# Patient Record
Sex: Female | Born: 1995 | Race: White | Hispanic: No | Marital: Single | Smoking: Former smoker
Health system: Southern US, Community
[De-identification: ages and names within clinical notes are randomized; demographics above are authoritative.]

## PROBLEM LIST (undated history)

## (undated) ENCOUNTER — Inpatient Hospital Stay (HOSPITAL_COMMUNITY): Payer: Self-pay

## (undated) DIAGNOSIS — T148XXA Other injury of unspecified body region, initial encounter: Secondary | ICD-10-CM

## (undated) DIAGNOSIS — B1920 Unspecified viral hepatitis C without hepatic coma: Secondary | ICD-10-CM

## (undated) DIAGNOSIS — F32A Depression, unspecified: Secondary | ICD-10-CM

## (undated) DIAGNOSIS — F4310 Post-traumatic stress disorder, unspecified: Secondary | ICD-10-CM

## (undated) DIAGNOSIS — F909 Attention-deficit hyperactivity disorder, unspecified type: Secondary | ICD-10-CM

## (undated) DIAGNOSIS — A64 Unspecified sexually transmitted disease: Secondary | ICD-10-CM

## (undated) DIAGNOSIS — T7840XA Allergy, unspecified, initial encounter: Secondary | ICD-10-CM

## (undated) DIAGNOSIS — F419 Anxiety disorder, unspecified: Secondary | ICD-10-CM

## (undated) DIAGNOSIS — J45909 Unspecified asthma, uncomplicated: Secondary | ICD-10-CM

## (undated) DIAGNOSIS — F1910 Other psychoactive substance abuse, uncomplicated: Secondary | ICD-10-CM

## (undated) DIAGNOSIS — F319 Bipolar disorder, unspecified: Secondary | ICD-10-CM

## (undated) DIAGNOSIS — F329 Major depressive disorder, single episode, unspecified: Secondary | ICD-10-CM

## (undated) DIAGNOSIS — R51 Headache: Secondary | ICD-10-CM

## (undated) DIAGNOSIS — N390 Urinary tract infection, site not specified: Secondary | ICD-10-CM

## (undated) HISTORY — PX: ADENOIDECTOMY: SUR15

## (undated) HISTORY — PX: TONSILLECTOMY: SUR1361

## (undated) HISTORY — PX: MYRINGOTOMY: SUR874

---
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
## 2016-01-17 ENCOUNTER — Telehealth: Payer: Self-pay | Admitting: *Deleted

## 2016-01-17 DIAGNOSIS — Z3042 Encounter for surveillance of injectable contraceptive: Secondary | ICD-10-CM

## 2016-01-17 MED ORDER — MEDROXYPROGESTERONE ACETATE 150 MG/ML IM SUSP: 150.0000 mg | INTRAMUSCULAR | 4 refills | Status: DC

## 2016-01-17 NOTE — Telephone Encounter (Signed)
Sent Depo to pharm per pt req

## 2016-01-18 ENCOUNTER — Encounter: Payer: Self-pay | Admitting: Family Medicine

## 2016-01-18 ENCOUNTER — Ambulatory Visit (INDEPENDENT_AMBULATORY_CARE_PROVIDER_SITE_OTHER): Payer: Medicaid Other | Admitting: Family Medicine

## 2016-01-18 VITALS — BP 105/68 | HR 73 | Temp 97.6°F | Wt 137.5 lb

## 2016-01-18 DIAGNOSIS — Z3042 Encounter for surveillance of injectable contraceptive: Secondary | ICD-10-CM | POA: Diagnosis not present

## 2016-01-18 DIAGNOSIS — Z3202 Encounter for pregnancy test, result negative: Secondary | ICD-10-CM

## 2016-01-18 LAB — POCT URINE PREGNANCY: Preg Test, Ur: NEGATIVE

## 2016-01-18 MED ORDER — MEDROXYPROGESTERONE ACETATE 150 MG/ML IM SUSP: 150.0000 mg | INTRAMUSCULAR | 2 refills | Status: DC

## 2016-01-18 MED ORDER — VENLAFAXINE HCL ER 75 MG PO CP24: 75.0000 mg | ORAL_CAPSULE | Freq: Every day | ORAL | 3 refills | Status: DC

## 2016-01-18 MED ORDER — HYDROXYZINE HCL 25 MG PO TABS
25.0000 mg | ORAL_TABLET | Freq: Three times a day (TID) | ORAL | 1 refills | Status: DC | PRN
Start: 2016-01-18 — End: 2017-06-19

## 2016-01-18 MED ORDER — MEDROXYPROGESTERONE ACETATE 150 MG/ML IM SUSP
150.0000 mg | INTRAMUSCULAR | Status: DC
Start: 2016-01-18 — End: 2017-12-25
  Administered 2016-01-18: 150 mg via INTRAMUSCULAR

## 2016-01-18 MED ORDER — NICOTINE 21 MG/24HR TD PT24: 21.0000 mg | MEDICATED_PATCH | Freq: Every day | TRANSDERMAL | 0 refills | Status: DC

## 2016-01-18 MED ORDER — NICOTINE 14 MG/24HR TD PT24: 14.0000 mg | MEDICATED_PATCH | Freq: Every day | TRANSDERMAL | 0 refills | Status: DC

## 2016-01-18 NOTE — Progress Notes (Signed)
Patient tolerated Depo injection well in Left arm.Marland Kitchen. Next appointment scheduled @ check-out.

## 2016-01-18 NOTE — Progress Notes (Signed)
Post Partum Exam  Leah Greene is a 20 y.o. G28P1001 female who presents for a postpartum visit. She is 6 weeks postpartum following a spontaneous vaginal delivery. I have fully reviewed the prenatal and intrapartum course. The delivery was at [redacted]w[redacted]d gestational weeks.  Anesthesia: epidural. Postpartum course has been complicated by CPS "taking the baby" and mother's underlying depression. Baby's course has been complicated by a NICU stay and being removed from her mother's custody. Baby is feeding by formula. Bleeding no bleeding. Bowel function is normal. Bladder function is normal. Patient is not sexually active. Contraception method is Depo-Provera injections. Postpartum depression screening:neg  Patient's mother monopolizes the conversation and MD needs to redirect to patient constantly The following portions of the patient's history were reviewed and updated as appropriate: allergies, current medications, past family history, past medical history, past social history, past surgical history and problem list.  Review of Systems Pertinent items are noted in HPI.   Objective:    BP 116/78 mmHg  Pulse 78  Resp 16  Ht 5\' 5"  (1.651 m)  Wt 211 lb (95.709 kg)  BMI 35.11 kg/m2  Breastfeeding? Yes  General:  alert, cooperative, appears stated age and tearful.    Breasts:  not performed. not breastfeeding. pumped x3 weeks  Lungs: normal WOB  Heart:  RR  Abdomen: soft, non-tender; bowel sounds normal; no masses,  no organomegaly   Vulva:  not evaluated  Vagina: not evaluated  Cervix:  not evaluated  Corpus: not examined  Adnexa:  not evaluated  Rectal Exam: Not performed.         Depression screen [facility] 2/9 01/18/2016 08/17/2015 07/06/2015  Decreased Interest 0 0 0  Down, Depressed, Hopeless 2 0 0  PHQ - 2 Score 2 0 0  Altered sleeping 2 - -  Tired, decreased energy 1 - -  Change in appetite 0 - -  Feeling bad or failure about yourself  2 - -  Trouble concentrating 1 - -  Moving slowly  or fidgety/restless 2 - -  Suicidal thoughts 0 - -  PHQ-9 Score 10 - -    Assessment:   Normal postpartum exam.  PHQ9 score 10.    Plan:   1. Contraception: Depo-Provera injections-- started today 2. Infant feeding: CPS has take custody of infant. Not pumping  3. Chronic medical conditions  Depression: extensive history of Depression/PTSD/Bipolar/ODD with sx today and PHQ9 10  - reviewed past medication with include prozac, lexapro, zoloft (caused SI), clonazepam, hydroxyzine  - Reports poor effect from all but has not tried SNRI (Effexor, Cymbalta, Pristiq). Patient open to starting  and expressed "I want to start something and my therapist thinks I need something"  -Continues to engage with therapy q week  - Reviewed 4-6 weeks for full effect of medication and to stop for SI/HI/worsening depression  -Rx for Effexor 75mg  sent to pharmacy-- anticipate psych taking over this rx and increasing if needed  -Hydroxyzine 25mg  q8 prn anxiety and sleep-- reviewed when to use for panic attacks and prn is  needed before bedtime to help with 'racing thoughts'  -Patient able to state when she would seek emergency psychiatric services and know of [facility] and  has used before  - Patient has appt with pysch on 10/12 to resume care    Tobacco use:  Desires to quit. Smoking > 1/2 ppd   Rx for Nicotine and sent to pharmacy. She will change dose q 1 month.   When dose  needed recommended discussion with PCP  3. Follow up in: 2 weeks or as needed.

## 2016-01-19 ENCOUNTER — Encounter: Payer: Self-pay | Admitting: *Deleted

## 2016-02-06 ENCOUNTER — Telehealth: Payer: Self-pay | Admitting: *Deleted

## 2016-02-06 DIAGNOSIS — N921 Excessive and frequent menstruation with irregular cycle: Secondary | ICD-10-CM

## 2016-02-06 MED ORDER — NORGESTIMATE-ETH ESTRADIOL 0.25-35 MG-MCG PO TABS: 1.0000 | ORAL_TABLET | Freq: Every day | ORAL | 3 refills | Status: DC

## 2016-02-06 NOTE — Telephone Encounter (Signed)
Pt just started the Depo, c/o breakthrough bleeding and stated she used Sprintec the last time she experienced it with starting the Depo and after a few months it resolved. Sent rx to pharmacy and instructed pt on medication use.

## 2016-02-06 NOTE — Telephone Encounter (Signed)
-----   Message from Olevia BowensJacinda S Battle sent at 02/06/2016  3:21 PM EDT ----- Regarding: Rx Request/Advise Contact: [phone] Having AUB w/ Depo, states she was given something in the past to stop it

## 2016-02-21 ENCOUNTER — Telehealth: Payer: Self-pay | Admitting: *Deleted

## 2016-02-21 NOTE — Telephone Encounter (Signed)
Pt received Depo Provera on 01-18-16, called the first of October c/o break through bleeding and requesting OCP to be sent to pharmacy to help with the bleeding.  Pt came in office today c/o bleeding every day.  Informed pt that she just started the OCP a few weeks ago and needs to give it more time, at least another month for her body to adjust.  Offered to draw a CBC and TSH to check levels, pt declined.  Instructed to continue taking the OCPs every day as instructed and call us back in a month if symptoms persist.

## 2016-04-04 ENCOUNTER — Ambulatory Visit: Payer: Self-pay

## 2016-04-05 ENCOUNTER — Ambulatory Visit: Payer: Medicaid Other

## 2016-06-24 ENCOUNTER — Emergency Department (HOSPITAL_COMMUNITY): Payer: Self-pay

## 2016-06-24 ENCOUNTER — Emergency Department (HOSPITAL_COMMUNITY)
Admission: EM | Admit: 2016-06-24 | Discharge: 2016-06-24 | Disposition: A | Discharge status: Home / Self Care | Payer: Self-pay | Attending: Emergency Medicine | Admitting: Emergency Medicine

## 2016-06-24 ENCOUNTER — Encounter (HOSPITAL_COMMUNITY): Payer: Self-pay | Admitting: Nurse Practitioner

## 2016-06-24 DIAGNOSIS — F909 Attention-deficit hyperactivity disorder, unspecified type: Secondary | ICD-10-CM | POA: Insufficient documentation

## 2016-06-24 DIAGNOSIS — F1910 Other psychoactive substance abuse, uncomplicated: Secondary | ICD-10-CM

## 2016-06-24 DIAGNOSIS — L03114 Cellulitis of left upper limb: Secondary | ICD-10-CM | POA: Insufficient documentation

## 2016-06-24 DIAGNOSIS — L03119 Cellulitis of unspecified part of limb: Secondary | ICD-10-CM

## 2016-06-24 DIAGNOSIS — Z7982 Long term (current) use of aspirin: Secondary | ICD-10-CM | POA: Insufficient documentation

## 2016-06-24 DIAGNOSIS — Z79899 Other long term (current) drug therapy: Secondary | ICD-10-CM | POA: Insufficient documentation

## 2016-06-24 DIAGNOSIS — L03113 Cellulitis of right upper limb: Secondary | ICD-10-CM | POA: Insufficient documentation

## 2016-06-24 DIAGNOSIS — Z91040 Latex allergy status: Secondary | ICD-10-CM | POA: Insufficient documentation

## 2016-06-24 DIAGNOSIS — F17210 Nicotine dependence, cigarettes, uncomplicated: Secondary | ICD-10-CM | POA: Insufficient documentation

## 2016-06-24 DIAGNOSIS — F332 Major depressive disorder, recurrent severe without psychotic features: Secondary | ICD-10-CM | POA: Insufficient documentation

## 2016-06-24 LAB — COMPREHENSIVE METABOLIC PANEL
ALK PHOS: 65 U/L (ref 38–126)
ALT: 104 U/L — ABNORMAL HIGH (ref 14–54)
ANION GAP: 8 (ref 5–15)
AST: 51 U/L — ABNORMAL HIGH (ref 15–41)
Albumin: 3.5 g/dL (ref 3.5–5.0)
BILIRUBIN TOTAL: 0.3 mg/dL (ref 0.3–1.2)
BUN: 9 mg/dL (ref 6–20)
CALCIUM: 8.5 mg/dL — AB (ref 8.9–10.3)
CO2: 24 mmol/L (ref 22–32)
Chloride: 106 mmol/L (ref 101–111)
Creatinine, Ser: 0.65 mg/dL (ref 0.44–1.00)
Glucose, Bld: 102 mg/dL — ABNORMAL HIGH (ref 65–99)
Potassium: 3.8 mmol/L (ref 3.5–5.1)
Sodium: 138 mmol/L (ref 135–145)
TOTAL PROTEIN: 6.4 g/dL — AB (ref 6.5–8.1)

## 2016-06-24 LAB — CBC
HEMATOCRIT: 34.4 % — AB (ref 36.0–46.0)
Hemoglobin: 11.4 g/dL — ABNORMAL LOW (ref 12.0–15.0)
MCH: 27.8 pg (ref 26.0–34.0)
MCHC: 33.1 g/dL (ref 30.0–36.0)
MCV: 83.9 fL (ref 78.0–100.0)
Platelets: 230 10*3/uL (ref 150–400)
RBC: 4.1 MIL/uL (ref 3.87–5.11)
RDW: 14.3 % (ref 11.5–15.5)
WBC: 5.7 10*3/uL (ref 4.0–10.5)

## 2016-06-24 LAB — PREGNANCY, URINE: PREG TEST UR: NEGATIVE

## 2016-06-24 LAB — ETHANOL

## 2016-06-24 LAB — ACETAMINOPHEN LEVEL

## 2016-06-24 LAB — RAPID URINE DRUG SCREEN, HOSP PERFORMED
Amphetamines: NOT DETECTED
BARBITURATES: NOT DETECTED
Benzodiazepines: NOT DETECTED
Cocaine: POSITIVE — AB
Opiates: NOT DETECTED
Tetrahydrocannabinol: POSITIVE — AB

## 2016-06-24 LAB — SALICYLATE LEVEL: Salicylate Lvl: <7 mg/dL (ref 2.8–30.0)

## 2016-06-24 MED ORDER — CLINDAMYCIN HCL 300 MG PO CAPS
300.0000 mg | ORAL_CAPSULE | Freq: Three times a day (TID) | ORAL | Status: DC
  Filled 2016-06-24 (×2): qty 1

## 2016-06-24 MED ORDER — CLINDAMYCIN HCL 150 MG PO CAPS: 300.0000 mg | ORAL_CAPSULE | Freq: Three times a day (TID) | ORAL | 0 refills | Status: AC

## 2016-06-24 NOTE — ED Triage Notes (Signed)
Pt states she is tired of being a harm to herself, states she is depressed, and tired doing drugs, last IVDU was 3 days ago when she "shot up cocaine." Denies suicidal or homicidal ideation. Requesting psychiatry evaluation and help.

## 2016-06-24 NOTE — ED Provider Notes (Signed)
[facility]-EMERGENCY DEPT Provider Note   CSN: [id number] Arrival date & time: 06/24/16  1530     History   Chief Complaint Chief Complaint  Patient presents with  . Depression    HPI Leah Greene is a 21 y.o. female.  HPI   21 year old female presents with concern for depression and drug addiction. Patient reports worsening depression over the last 4 months, and the sensation that if she does not get help soon she might end up killing herself with drug use. Reports that she is primarily smoking marijuana and using cocaine. She is injecting cocaine. Reports she sees therapist however has not been as good about going as she has been using drugs. ALso reports she has rx for medications however has not been compliant taking them.  Reports painful skin lesions from injecting. Also reports cough. Denies fevers.   Past Medical History:  Diagnosis Date  . ADHD (attention deficit hyperactivity disorder)   . Allergy   . Anxiety    was on meds - stopped with preg  . Anxiety   . Bipolar 1 disorder (HCC)   . Depression   . Fractured bone    rt and left ankles; sport activity  . Headache(784.0)   . PTSD (post-traumatic stress disorder)   . Sexually transmitted disease (STD)    unsure of type  . Urinary tract infection     Patient Active Problem List   Diagnosis Date Noted  . MDD (major depressive disorder), recurrent episode, severe (HCC) 07/12/2015  . Bipolar I disorder, most recent episode manic, severe without psychotic features (HCC) 08/31/2014  . Family history of breast cancer in first degree relative 03/03/2014  . Cannabis use disorder, moderate, dependence (HCC) 04/07/2013  . ODD (oppositional defiant disorder) 04/04/2013  . ADHD (attention deficit hyperactivity disorder), combined type 04/04/2013  . PTSD (post-traumatic stress disorder) 04/04/2013  . GERD (gastroesophageal reflux disease) 09/30/2012    Past Surgical History:  Procedure Laterality Date  . ADENOIDECTOMY     . MYRINGOTOMY    . TONSILLECTOMY      OB History    Gravida Para Term Preterm AB Living   2 2 1 1  0 2   SAB TAB Ectopic Multiple Live Births   0       2       Home Medications    Prior to Admission medications   Medication Sig Start Date End Date Taking? Authorizing Provider  aspirin 325 MG EC tablet Take 325 mg by mouth as needed for pain.   Yes Historical Provider, MD  cetirizine (ZYRTEC) 10 MG tablet Take 10 mg by mouth daily as needed for allergies.   Yes Historical Provider, MD  ibuprofen (ADVIL,MOTRIN) 600 MG tablet Take 1 tablet (600 mg total) by mouth every 6 (six) hours. Patient taking differently: Take 400 mg by mouth every 6 (six) hours.  12/12/15  Yes Tereso NewcomerUgonna A Anyanwu, MD  medroxyPROGESTERone (DEPO-PROVERA) 150 MG/ML injection Inject 1 mL (150 mg total) into the muscle every 3 (three) months. 01/18/16  Yes Federico FlakeKimberly Niles Newton, MD  venlafaxine XR (EFFEXOR-XR) 75 MG 24 hr capsule Take 1 capsule (75 mg total) by mouth daily. 01/18/16  Yes Federico FlakeKimberly Niles Newton, MD  clindamycin (CLEOCIN) 150 MG capsule Take 2 capsules (300 mg total) by mouth 3 (three) times daily. 06/24/16 07/01/16  Alvira MondayErin Somtochukwu Woollard, MD  hydrOXYzine (ATARAX/VISTARIL) 25 MG tablet Take 1 tablet (25 mg total) by mouth 3 (three) times daily as needed. Patient not taking: Reported on 06/24/2016  01/18/16   Federico Flake, MD  nicotine (NICODERM CQ) 14 mg/24hr patch Place 1 patch (14 mg total) onto the skin daily. 01/18/16   Federico Flake, MD  nicotine (NICODERM CQ) 21 mg/24hr patch Place 1 patch (21 mg total) onto the skin daily. Patient not taking: Reported on 06/24/2016 01/18/16   Federico Flake, MD  norgestimate-ethinyl estradiol (ORTHO-CYCLEN,SPRINTEC,PREVIFEM) 0.25-35 MG-MCG tablet Take 1 tablet by mouth daily. Patient not taking: Reported on 06/24/2016 02/06/16   Tereso Newcomer, MD    Family History Family History  Problem Relation Age of Onset  . Diabetes Mother   . Cancer Mother      breast, ovarian  . Bipolar disorder Maternal Grandfather     Social History Social History  Substance Use Topics  . Smoking status: Current Every Day Smoker    Packs/day: 0.25    Types: Cigarettes  . Smokeless tobacco: Never Used  . Alcohol use No     Allergies   Amoxicillin; Promethazine; Latex; Penicillins; and Vancomycin   Review of Systems Review of Systems  Constitutional: Negative for fever.  HENT: Negative for sore throat.   Eyes: Negative for visual disturbance.  Respiratory: Positive for cough. Negative for shortness of breath.   Cardiovascular: Negative for chest pain.  Gastrointestinal: Negative for abdominal pain, nausea and vomiting.  Genitourinary: Negative for difficulty urinating.  Musculoskeletal: Negative for back pain and neck pain.  Skin: Positive for wound. Negative for rash.  Neurological: Negative for syncope and headaches.     Physical Exam Updated Vital Signs BP (!) 114/44 (BP Location: Right Arm)   Pulse 76   Temp 98.5 F (36.9 C) (Oral)   Resp 16   Ht 5\' 4"  (1.626 m)   Wt 127 lb (57.6 kg)   SpO2 97%   BMI 21.80 kg/m   Physical Exam  Constitutional: She is oriented to person, place, and time. She appears well-developed and well-nourished. No distress.  HENT:  Head: Normocephalic and atraumatic.  Eyes: Conjunctivae and EOM are normal.  Neck: Normal range of motion.  Cardiovascular: Normal rate, regular rhythm, normal heart sounds and intact distal pulses.  Exam reveals no gallop and no friction rub.   No murmur heard. Pulmonary/Chest: Effort normal and breath sounds normal. No respiratory distress. She has no wheezes. She has no rales.  Abdominal: Soft. She exhibits no distension. There is no tenderness. There is no guarding.  Musculoskeletal: She exhibits no edema or tenderness.  Neurological: She is alert and oriented to person, place, and time.  Skin: Skin is warm and dry. No rash noted. She is not diaphoretic.  Multiple linear  scars over bilateral forearms, hands. Right AC with 1cm area of erythema, nodular, no fluctuance Right wrist with tender area 2cm, no fluctuance, nodular, mild overlying erythema. Left posterior forearm with 2cm area nodular swelling, no fluctuance  Nursing note and vitals reviewed.    ED Treatments / Results  Labs (all labs ordered are listed, but only abnormal results are displayed) Labs Reviewed  COMPREHENSIVE METABOLIC PANEL - Abnormal; Notable for the following:       Result Value   Glucose, Bld 102 (*)    Calcium 8.5 (*)    Total Protein 6.4 (*)    AST 51 (*)    ALT 104 (*)    All other components within normal limits  ACETAMINOPHEN LEVEL - Abnormal; Notable for the following:    Acetaminophen (Tylenol), Serum <10 (*)    All other components within normal  limits  CBC - Abnormal; Notable for the following:    Hemoglobin 11.4 (*)    HCT 34.4 (*)    All other components within normal limits  RAPID URINE DRUG SCREEN, HOSP PERFORMED - Abnormal; Notable for the following:    Cocaine POSITIVE (*)    Tetrahydrocannabinol POSITIVE (*)    All other components within normal limits  ETHANOL  SALICYLATE LEVEL  PREGNANCY, URINE    EKG  EKG Interpretation None       Radiology Dg Chest 2 View  Result Date: 06/24/2016 CLINICAL DATA:  Cough.  Concern for foreign body. EXAM: CHEST  2 VIEW COMPARISON:  None. FINDINGS: The cardiomediastinal contours are normal. The lungs are clear. Pulmonary vasculature is normal. No consolidation, pleural effusion, or pneumothorax. No acute osseous abnormalities are seen. No radiopaque foreign body. IMPRESSION: No active cardiopulmonary disease.  No radiopaque foreign body. Electronically Signed   By: Rubye Oaks M.D.   On: 06/24/2016 19:09   Dg Forearm Left  Result Date: 06/24/2016 CLINICAL DATA:  IV drug use, concern for foreign body. EXAM: LEFT FOREARM - 2 VIEW COMPARISON:  None. FINDINGS: There is no evidence of fracture or other focal bone  lesions. No periosteal reaction or abnormal density. Wrist and elbow alignment is maintained. Mild soft tissue edema about the mid forearm. No radiopaque foreign body. IMPRESSION: Mild soft tissue edema. No radiopaque foreign body. No acute osseous abnormality. Electronically Signed   By: Rubye Oaks M.D.   On: 06/24/2016 19:11   Dg Forearm Right  Result Date: 06/24/2016 CLINICAL DATA:  IV drug use, concern for foreign body. EXAM: RIGHT FOREARM - 2 VIEW COMPARISON:  None. FINDINGS: There is no evidence of fracture or other focal bone lesions. No periosteal reaction or abnormal density. Wrist and elbow alignment is maintained. Multifocal/diffuse soft tissue edema about the forearm, most prominent about the dorsal surface. No evident soft tissue air. No radiopaque foreign body. IMPRESSION: Multifocal/diffuse soft tissue edema. No radiopaque foreign body. No acute osseous abnormality. Electronically Signed   By: Rubye Oaks M.D.   On: 06/24/2016 19:11    Procedures [place] bedside Date/Time: 06/25/2016 2:46 PM Performed by: Alvira Monday Authorized by: Alvira Monday  Consent: Verbal consent obtained. Required items: required blood products, implants, devices, and special equipment available Patient identity confirmed: verbally with patient Time out: Immediately prior to procedure a "time out" was called to verify the correct patient, procedure, equipment, support staff and site/side marked as required. Patient tolerance: Patient tolerated the procedure well with no immediate complications Comments: Bedside US, softissue to evaluate for abscess 2 views taken 2 different areas of concern on right forearm and left forearm Signs of tissue edema No significant discrete fluid collection     (including critical care time)  Medications Ordered in ED Medications - No data to display   Initial Impression / Assessment and Plan / ED Course  I have reviewed the triage vital signs and the  nursing notes.  Pertinent labs & imaging results that were available during my care of the patient were reviewed by me and considered in my medical decision making (see chart for details).    21yo female with history of bipolar presents with concern for worsening depression and substance abuse.  She denies active SI/HI/hallucinations but reports she is worried she will kill herself if her substance abuse continues this way. TTS evaluated patient and do not feel she meets inpatient criteria and recommend continued outpt follow up and provided resources for substance  abuse treatment.   Patient also with multiple track marks, some with surrounding areas of inflammation, nodularity, suspect scarring and cellulitis. No clear fluid pockets on bedside US nor fluctuance on exam.  Pt without fever.  Gave rx for clindamycin for cellulitis and recommended continued close monitoring of areas, trying warm compresses.  She has elevation of LFTs also noted on prior labs. Recommend testing for hepatitis/HIV and pt reports she will have this done as oupt. Patient discharged in stable condition with understanding of reasons to return.   Final Clinical Impressions(s) / ED Diagnoses   Final diagnoses:  Severe episode of recurrent major depressive disorder, without psychotic features (HCC)  Drug abuse  Cellulitis of upper extremity, unspecified laterality    New Prescriptions Discharge Medication List as of 06/24/2016  8:21 PM    START taking these medications   Details  clindamycin (CLEOCIN) 150 MG capsule Take 2 capsules (300 mg total) by mouth 3 (three) times daily., Starting Sun 06/24/2016, Until Sun 07/01/2016, Print         Alvira Monday, MD 06/25/16 1451

## 2016-06-24 NOTE — ED Notes (Signed)
Went into the room to introduce self to pt. Pt found to be sleeping with rise and fall of chest noted.

## 2016-06-24 NOTE — BH Assessment (Signed)
Pt provided with resources for SA treatment programs and suicide prevention information.   Princess BruinsAquicha Duff, MSW, Theresia MajorsLCSWA
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
## 2016-06-24 NOTE — ED Notes (Signed)
Pt has given verbal consent to share information with her mother Dorise BullionDawn Heng at [phone] cell phone preferable, home phone [phone]

## 2016-10-10 ENCOUNTER — Emergency Department (HOSPITAL_COMMUNITY)
Admission: EM | Admit: 2016-10-10 | Discharge: 2016-10-11 | Disposition: A | Discharge status: Home / Self Care | Payer: No Typology Code available for payment source | Source: Home / Self Care

## 2016-10-10 ENCOUNTER — Encounter (HOSPITAL_COMMUNITY): Payer: Self-pay | Admitting: *Deleted

## 2016-10-10 DIAGNOSIS — S0990XA Unspecified injury of head, initial encounter: Secondary | ICD-10-CM | POA: Diagnosis present

## 2016-10-10 DIAGNOSIS — S299XXA Unspecified injury of thorax, initial encounter: Secondary | ICD-10-CM | POA: Insufficient documentation

## 2016-10-10 DIAGNOSIS — S20219A Contusion of unspecified front wall of thorax, initial encounter: Secondary | ICD-10-CM | POA: Diagnosis not present

## 2016-10-10 DIAGNOSIS — R51 Headache: Secondary | ICD-10-CM | POA: Diagnosis not present

## 2016-10-10 DIAGNOSIS — Y92410 Unspecified street and highway as the place of occurrence of the external cause: Secondary | ICD-10-CM | POA: Insufficient documentation

## 2016-10-10 DIAGNOSIS — Y939 Activity, unspecified: Secondary | ICD-10-CM

## 2016-10-10 DIAGNOSIS — F1290 Cannabis use, unspecified, uncomplicated: Secondary | ICD-10-CM | POA: Diagnosis not present

## 2016-10-10 DIAGNOSIS — Z79899 Other long term (current) drug therapy: Secondary | ICD-10-CM | POA: Diagnosis not present

## 2016-10-10 DIAGNOSIS — Y999 Unspecified external cause status: Secondary | ICD-10-CM

## 2016-10-10 DIAGNOSIS — J45909 Unspecified asthma, uncomplicated: Secondary | ICD-10-CM | POA: Diagnosis not present

## 2016-10-10 DIAGNOSIS — S161XXA Strain of muscle, fascia and tendon at neck level, initial encounter: Secondary | ICD-10-CM | POA: Diagnosis not present

## 2016-10-10 DIAGNOSIS — S93402A Sprain of unspecified ligament of left ankle, initial encounter: Secondary | ICD-10-CM | POA: Diagnosis not present

## 2016-10-10 DIAGNOSIS — F17210 Nicotine dependence, cigarettes, uncomplicated: Secondary | ICD-10-CM | POA: Diagnosis not present

## 2016-10-10 DIAGNOSIS — Z91040 Latex allergy status: Secondary | ICD-10-CM | POA: Diagnosis not present

## 2016-10-10 DIAGNOSIS — Y929 Unspecified place or not applicable: Secondary | ICD-10-CM | POA: Diagnosis not present

## 2016-10-10 DIAGNOSIS — S0003XA Contusion of scalp, initial encounter: Secondary | ICD-10-CM | POA: Diagnosis not present

## 2016-10-10 HISTORY — DX: Unspecified viral hepatitis C without hepatic coma: B19.20

## 2016-10-10 HISTORY — DX: Unspecified asthma, uncomplicated: J45.909
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
## 2016-10-10 NOTE — ED Notes (Signed)
Called for room x2 no answer. 

## 2016-10-11 ENCOUNTER — Encounter (HOSPITAL_COMMUNITY): Payer: Self-pay | Admitting: Emergency Medicine

## 2016-10-11 ENCOUNTER — Emergency Department (HOSPITAL_COMMUNITY): Payer: No Typology Code available for payment source

## 2016-10-11 ENCOUNTER — Emergency Department (HOSPITAL_COMMUNITY)
Admission: EM | Admit: 2016-10-11 | Discharge: 2016-10-11 | Disposition: A | Discharge status: Home / Self Care | Payer: No Typology Code available for payment source | Attending: Emergency Medicine | Admitting: Emergency Medicine

## 2016-10-11 DIAGNOSIS — Y939 Activity, unspecified: Secondary | ICD-10-CM | POA: Insufficient documentation

## 2016-10-11 DIAGNOSIS — S161XXA Strain of muscle, fascia and tendon at neck level, initial encounter: Secondary | ICD-10-CM | POA: Insufficient documentation

## 2016-10-11 DIAGNOSIS — Z91040 Latex allergy status: Secondary | ICD-10-CM | POA: Insufficient documentation

## 2016-10-11 DIAGNOSIS — Z79899 Other long term (current) drug therapy: Secondary | ICD-10-CM | POA: Insufficient documentation

## 2016-10-11 DIAGNOSIS — Z419 Encounter for procedure for purposes other than remedying health state, unspecified: Secondary | ICD-10-CM

## 2016-10-11 DIAGNOSIS — S20219A Contusion of unspecified front wall of thorax, initial encounter: Secondary | ICD-10-CM

## 2016-10-11 DIAGNOSIS — S93402A Sprain of unspecified ligament of left ankle, initial encounter: Secondary | ICD-10-CM | POA: Insufficient documentation

## 2016-10-11 DIAGNOSIS — R51 Headache: Secondary | ICD-10-CM | POA: Insufficient documentation

## 2016-10-11 DIAGNOSIS — F1290 Cannabis use, unspecified, uncomplicated: Secondary | ICD-10-CM | POA: Insufficient documentation

## 2016-10-11 DIAGNOSIS — F17210 Nicotine dependence, cigarettes, uncomplicated: Secondary | ICD-10-CM | POA: Insufficient documentation

## 2016-10-11 DIAGNOSIS — J45909 Unspecified asthma, uncomplicated: Secondary | ICD-10-CM | POA: Insufficient documentation

## 2016-10-11 DIAGNOSIS — R52 Pain, unspecified: Secondary | ICD-10-CM

## 2016-10-11 DIAGNOSIS — Y929 Unspecified place or not applicable: Secondary | ICD-10-CM | POA: Insufficient documentation

## 2016-10-11 DIAGNOSIS — S0003XA Contusion of scalp, initial encounter: Secondary | ICD-10-CM | POA: Insufficient documentation

## 2016-10-11 DIAGNOSIS — Y999 Unspecified external cause status: Secondary | ICD-10-CM | POA: Insufficient documentation

## 2016-10-11 LAB — I-STAT CHEM 8, ED
BUN: 11 mg/dL (ref 6–20)
CREATININE: 0.6 mg/dL (ref 0.44–1.00)
Calcium, Ion: 1.12 mmol/L — ABNORMAL LOW (ref 1.15–1.40)
Chloride: 100 mmol/L — ABNORMAL LOW (ref 101–111)
Glucose, Bld: 81 mg/dL (ref 65–99)
HEMATOCRIT: 37 % (ref 36.0–46.0)
Hemoglobin: 12.6 g/dL (ref 12.0–15.0)
POTASSIUM: 3.7 mmol/L (ref 3.5–5.1)
Sodium: 138 mmol/L (ref 135–145)
TCO2: 27 mmol/L (ref 0–100)

## 2016-10-11 LAB — RAPID URINE DRUG SCREEN, HOSP PERFORMED
AMPHETAMINES: NOT DETECTED
BARBITURATES: NOT DETECTED
BENZODIAZEPINES: NOT DETECTED
Cocaine: POSITIVE — AB
Opiates: POSITIVE — AB
TETRAHYDROCANNABINOL: POSITIVE — AB

## 2016-10-11 LAB — POC URINE PREG, ED: Preg Test, Ur: NEGATIVE

## 2016-10-11 MED ORDER — DICLOFENAC SODIUM 50 MG PO TBEC
50.0000 mg | DELAYED_RELEASE_TABLET | Freq: Two times a day (BID) | ORAL | 0 refills | Status: DC
Start: 2016-10-11 — End: 2017-06-19

## 2016-10-11 MED ORDER — CYCLOBENZAPRINE HCL 5 MG PO TABS: 5.0000 mg | ORAL_TABLET | Freq: Three times a day (TID) | ORAL | 0 refills | Status: DC | PRN

## 2016-10-11 MED ORDER — BACITRACIN ZINC 500 UNIT/GM EX OINT: TOPICAL_OINTMENT | Freq: Two times a day (BID) | CUTANEOUS | Status: DC
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
## 2016-10-11 NOTE — ED Notes (Signed)
Patient transported to X-ray 
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
## 2016-10-11 NOTE — Discharge Instructions (Signed)
Take the medication as directed. Do not drive while taking the medication as it will make you sleepy. Follow up with your doctor. Return here as needed.

## 2016-10-11 NOTE — Progress Notes (Signed)
Orthopedic Tech Progress Note Patient Details:  Treasa Schoolheresa R [place] Nov 29, 1995 [id number]  Ortho Devices Type of Ortho Device: ASO Ortho Device/Splint Location: LLE Ortho Device/Splint Interventions: Ordered, Application   Jennye MoccasinHughes, Tandrea Kommer Craig 10/11/2016, 5:52 PM

## 2016-11-03 IMAGING — US US MFM OB LIMITED
1 series · 15 of 27 positions shown · non-contrast
Comparison: none

MAU/Triage

1  RYHEM ROSI             [id number]      [id number]     [id number]
Indications
Vaginal bleeding in pregnancy, second
trimester
15 weeks gestation of pregnancy
OB History
Gravidity:    2         Term:   1        Prem:   0        SAB:   0
TOP:          0       Ectopic:  0        Living: 1
Fetal Evaluation
Num Of Fetuses:     1
Fetal Heart         150
Rate(bpm):
Cardiac Activity:   Observed
Presentation:       Cephalic
Placenta:           Anterior, above cervical os
Amniotic Fluid
AFI FV:      Subjectively within normal limits
Comment:    Large subchorionic hemorrhage noted, in inferior and rt
lateral uterus.
Gestational Age
Clinical EDD:  15w 0d                                        EDD:   01/16/16
Best:          15w 0d     Det. By:  Clinical EDD             EDD:   01/16/16
Cervix Uterus Adnexa
Left Ovary
Within normal limits.
Right Ovary
Impression
INDICATION: 19 yr old 88Z1OO1 at 28w8d with vaginal bleeding
for fetal ultrasound. Remote read.

[Series 1: us mfm ob limited · 27 acquisitions, 15 frames shown]
[im 1/27]
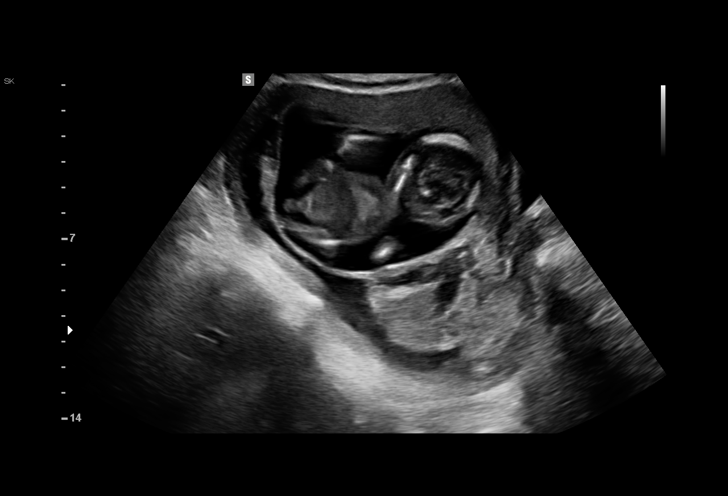
[im 3/27]
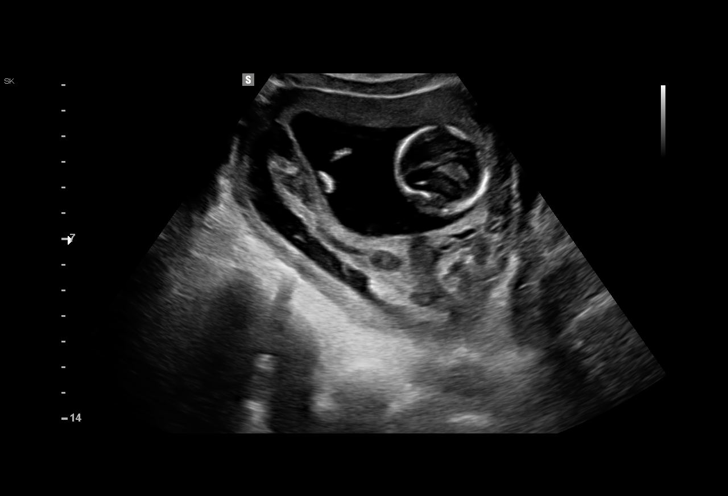
[im 5/27]
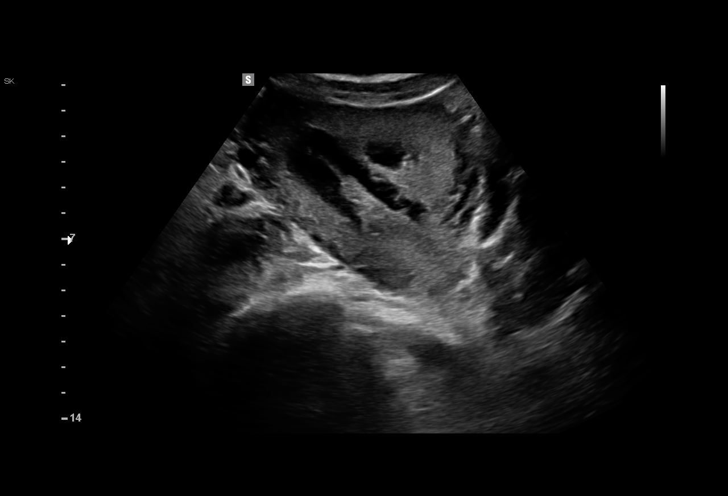
[im 7/27]
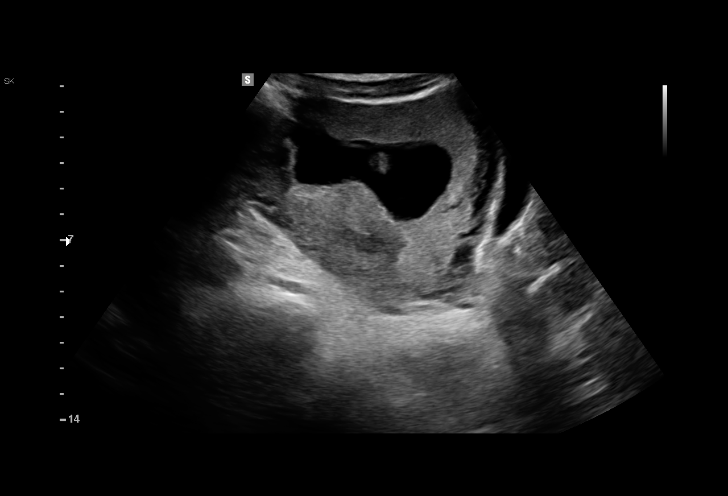
[im 9/27]
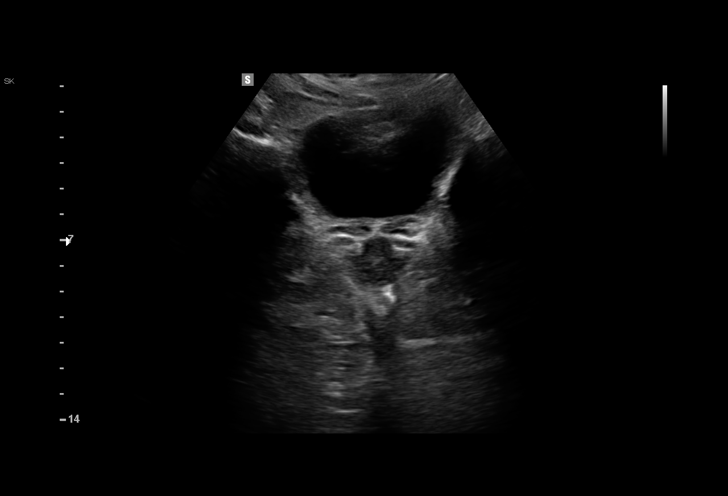
[im 10/27]
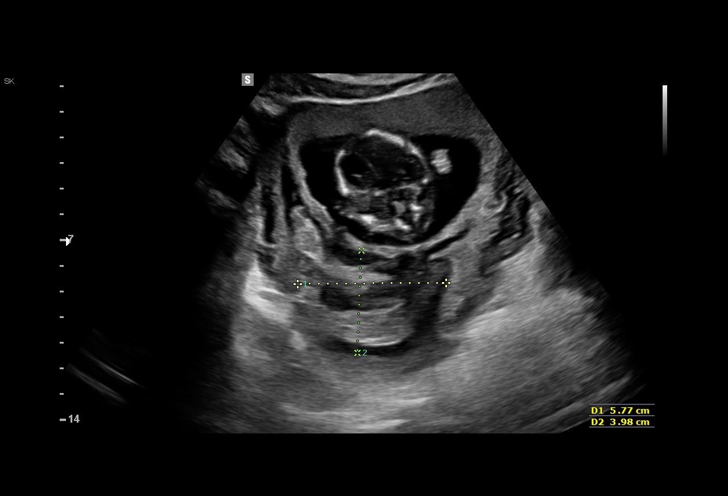
[im 12/27]
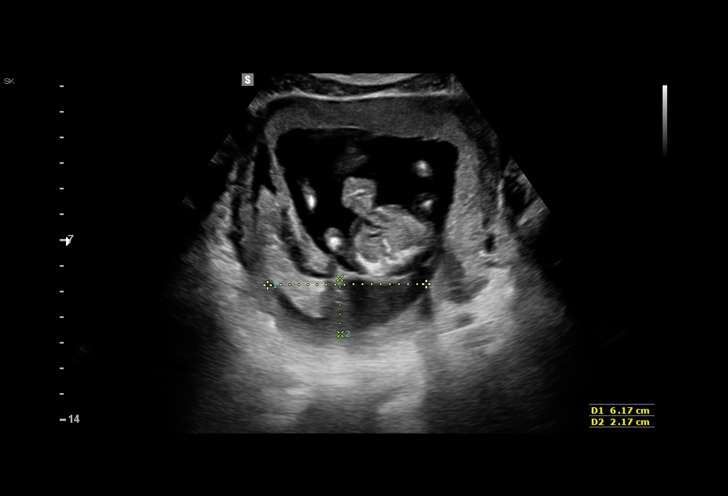
[im 14/27]
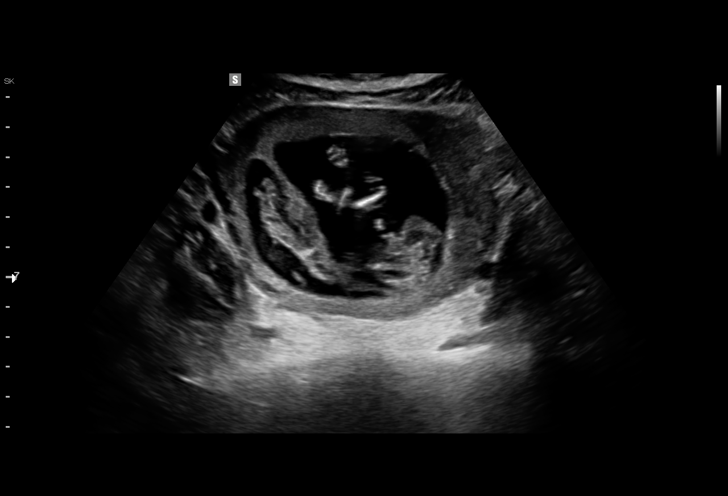
[im 16/27]
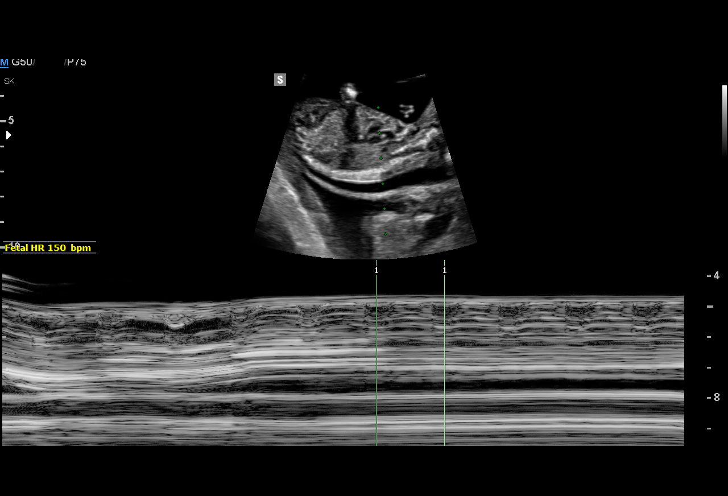
[im 18/27]
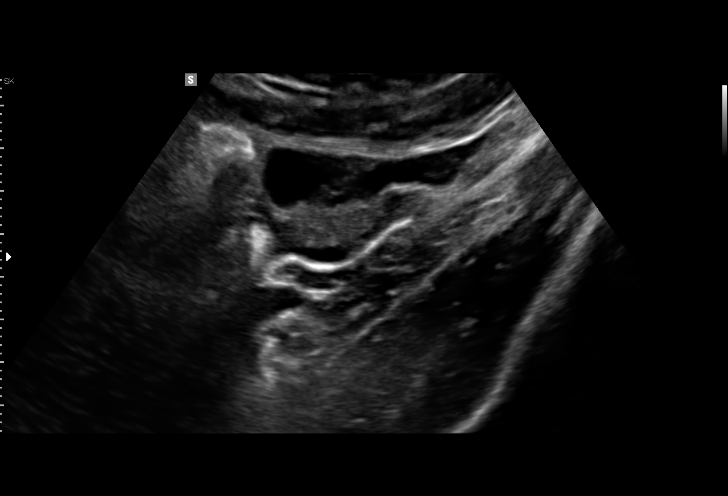
[im 19/27]
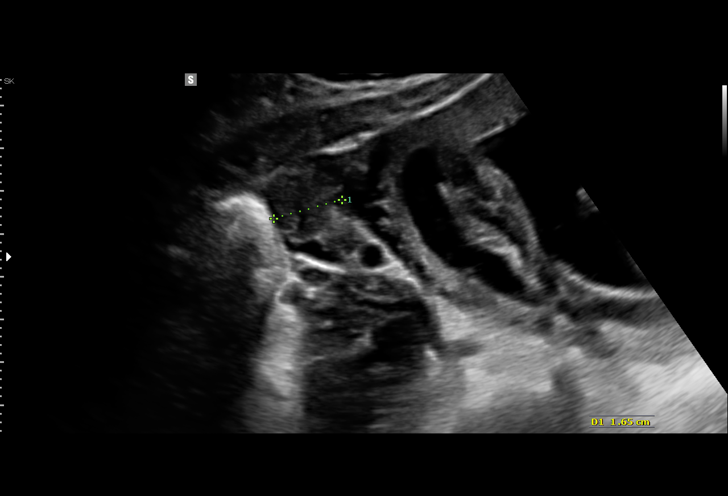
[im 21/27]
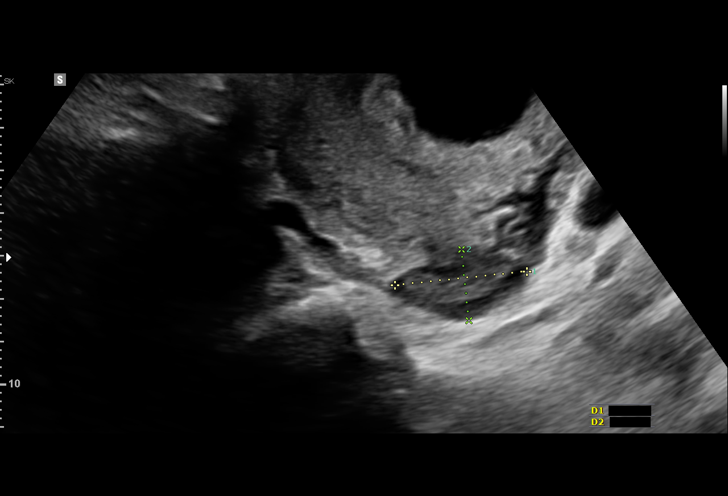
[im 23/27]
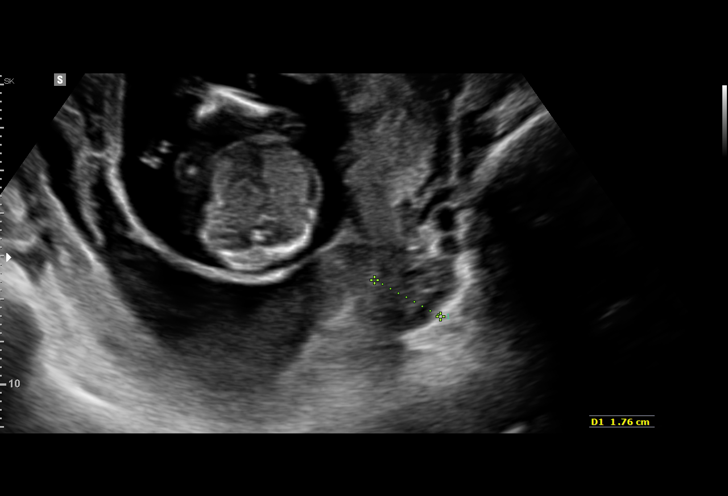
[im 25/27]
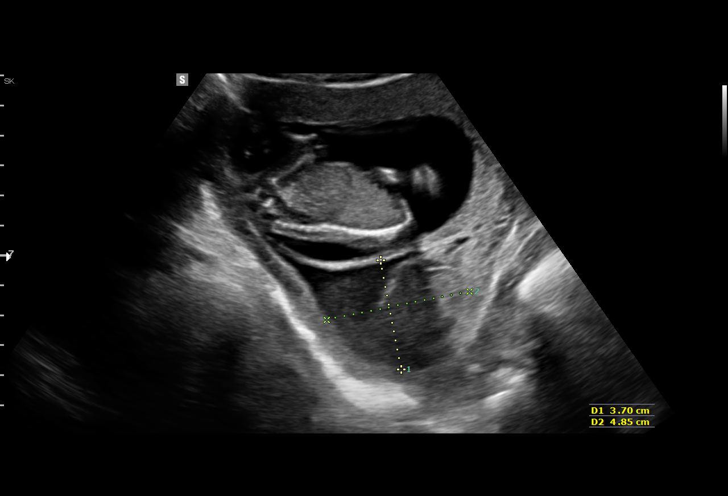
[im 27/27]
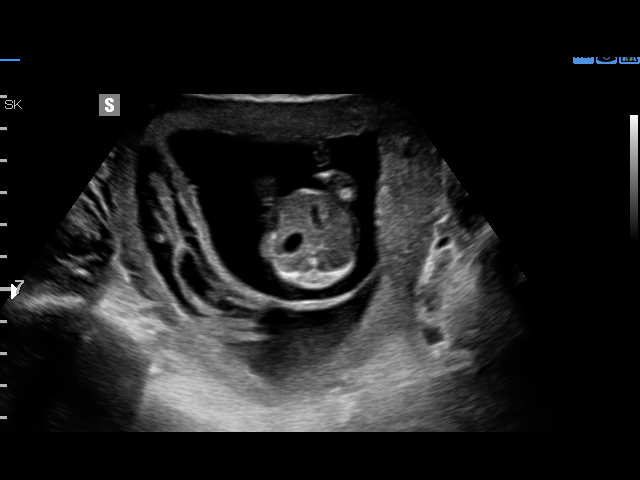

[15 of 27 positions shown; findings below may reference images not displayed]

FINDINGS: 1. Single intrauterine pregnancy.
2. Anterior placenta.
3. Normal amniotic fluid volume.
4. There is a large subchorionic hemorrhage seen inferior
and right lateral.
Recommendations

1. Vaginal bleeding:
- large subchorionic hematoma seen
- management per primary OB

## 2016-11-07 ENCOUNTER — Ambulatory Visit: Payer: Self-pay

## 2016-11-19 ENCOUNTER — Ambulatory Visit: Payer: Self-pay | Admitting: Obstetrics & Gynecology

## 2016-11-30 ENCOUNTER — Encounter: Payer: Self-pay | Admitting: Obstetrics & Gynecology

## 2017-03-07 ENCOUNTER — Ambulatory Visit: Payer: Self-pay | Admitting: Advanced Practice Midwife

## 2017-04-17 ENCOUNTER — Encounter (HOSPITAL_COMMUNITY): Payer: Self-pay | Admitting: Emergency Medicine

## 2017-04-17 ENCOUNTER — Other Ambulatory Visit: Payer: Self-pay

## 2017-04-17 ENCOUNTER — Emergency Department (HOSPITAL_COMMUNITY)
Admission: EM | Admit: 2017-04-17 | Discharge: 2017-04-17 | Disposition: A | Discharge status: Home / Self Care | Payer: No Typology Code available for payment source | Attending: Emergency Medicine | Admitting: Emergency Medicine

## 2017-04-17 DIAGNOSIS — Z5321 Procedure and treatment not carried out due to patient leaving prior to being seen by health care provider: Secondary | ICD-10-CM | POA: Insufficient documentation

## 2017-04-17 DIAGNOSIS — L02413 Cutaneous abscess of right upper limb: Secondary | ICD-10-CM | POA: Insufficient documentation

## 2017-04-17 DIAGNOSIS — L02414 Cutaneous abscess of left upper limb: Secondary | ICD-10-CM | POA: Diagnosis present

## 2017-04-17 LAB — CBC WITH DIFFERENTIAL/PLATELET
BASOS PCT: 0 %
Basophils Absolute: 0 10*3/uL (ref 0.0–0.1)
EOS ABS: 0.2 10*3/uL (ref 0.0–0.7)
EOS PCT: 2 %
HEMATOCRIT: 41.1 % (ref 36.0–46.0)
Hemoglobin: 13.6 g/dL (ref 12.0–15.0)
Lymphocytes Relative: 22 %
Lymphs Abs: 2.4 10*3/uL (ref 0.7–4.0)
MCH: 30.3 pg (ref 26.0–34.0)
MCHC: 33.1 g/dL (ref 30.0–36.0)
MCV: 91.5 fL (ref 78.0–100.0)
MONO ABS: 0.5 10*3/uL (ref 0.1–1.0)
MONOS PCT: 5 %
NEUTROS ABS: 7.6 10*3/uL (ref 1.7–7.7)
Neutrophils Relative %: 71 %
PLATELETS: 351 10*3/uL (ref 150–400)
RBC: 4.49 MIL/uL (ref 3.87–5.11)
RDW: 13.9 % (ref 11.5–15.5)
WBC: 10.6 10*3/uL — ABNORMAL HIGH (ref 4.0–10.5)

## 2017-04-17 LAB — COMPREHENSIVE METABOLIC PANEL
ALBUMIN: 3.9 g/dL (ref 3.5–5.0)
ALK PHOS: 75 U/L (ref 38–126)
ALT: 107 U/L — AB (ref 14–54)
AST: 76 U/L — ABNORMAL HIGH (ref 15–41)
Anion gap: 10 (ref 5–15)
BILIRUBIN TOTAL: 0.6 mg/dL (ref 0.3–1.2)
BUN: 12 mg/dL (ref 6–20)
CALCIUM: 9 mg/dL (ref 8.9–10.3)
CO2: 26 mmol/L (ref 22–32)
CREATININE: 0.72 mg/dL (ref 0.44–1.00)
Chloride: 103 mmol/L (ref 101–111)
GFR calc Af Amer: >60 mL/min (ref >60)
GFR calc non Af Amer: >60 mL/min (ref >60)
GLUCOSE: 99 mg/dL (ref 65–99)
Potassium: 4 mmol/L (ref 3.5–5.1)
SODIUM: 139 mmol/L (ref 135–145)
Total Protein: 7.7 g/dL (ref 6.5–8.1)

## 2017-04-17 LAB — I-STAT BETA HCG BLOOD, ED (MC, WL, AP ONLY): I-stat hCG, quantitative: <5 m[IU]/mL (ref <5)

## 2017-04-17 LAB — I-STAT CG4 LACTIC ACID, ED: Lactic Acid, Venous: 1.95 mmol/L — ABNORMAL HIGH (ref 0.5–1.9)

## 2017-04-17 NOTE — ED Notes (Signed)
Pt to the desk, stated that she was leaving and would return tomorrow.

## 2017-04-17 NOTE — ED Notes (Signed)
NOTE: Pt NOT in waiting room. She states that she is with another Pt here in the ED in room D31.

## 2017-04-17 NOTE — ED Triage Notes (Signed)
C/o multiple abscesses on bilateral arms and L hand for a few weeks from IV drug use.  Denies fever.  Reports chills and pain to bilateral arms.  Reports uses IV cocaine and last used heroin 1 month ago.

## 2017-06-19 ENCOUNTER — Other Ambulatory Visit (INDEPENDENT_AMBULATORY_CARE_PROVIDER_SITE_OTHER): Payer: Medicaid Other | Admitting: *Deleted

## 2017-06-19 ENCOUNTER — Other Ambulatory Visit (HOSPITAL_COMMUNITY)
Admission: RE | Admit: 2017-06-19 | Discharge: 2017-06-19 | Disposition: A | Discharge status: Home / Self Care | Payer: Medicaid Other | Source: Ambulatory Visit | Attending: Family Medicine | Admitting: Family Medicine

## 2017-06-19 ENCOUNTER — Ambulatory Visit: Payer: Self-pay | Admitting: Family Medicine

## 2017-06-19 DIAGNOSIS — Z202 Contact with and (suspected) exposure to infections with a predominantly sexual mode of transmission: Secondary | ICD-10-CM | POA: Diagnosis present

## 2017-06-19 DIAGNOSIS — Z7253 High risk bisexual behavior: Secondary | ICD-10-CM | POA: Diagnosis not present

## 2017-06-19 DIAGNOSIS — B1920 Unspecified viral hepatitis C without hepatic coma: Secondary | ICD-10-CM

## 2017-06-19 DIAGNOSIS — F1990 Other psychoactive substance use, unspecified, uncomplicated: Secondary | ICD-10-CM

## 2017-06-19 NOTE — Progress Notes (Signed)
SUBJECTIVE:  22 y.o. female complains of possible STD exposure. She started IV drug use in 2017 and states she "knows she has Hep C".but thinks there might be "other things going on". Denies abnormal vaginal bleeding or significant pelvic pain orfever. No UTI symptoms. She would like to start Depo Provera again.    No LMP recorded.  OBJECTIVE:  She appears well, afebrile.   ASSESSMENT:  Possible STD exposure   PLAN:  Advised pt to go to health department and request Naloxone kit GC, chlamydia, trichomonas, BVAG, CVAG probe sent to lab. HIV,RPR,HEP B, Hep C to [facility] Make appointment for annual GYN to restart Depo Provera Treatment: To be determined once lab results are received ROV prn if symptoms persist or worsen.

## 2017-06-20 LAB — CERVICOVAGINAL ANCILLARY ONLY
Bacterial vaginitis: NEGATIVE
CANDIDA VAGINITIS: NEGATIVE
CHLAMYDIA, DNA PROBE: NEGATIVE
NEISSERIA GONORRHEA: POSITIVE — AB
TRICH (WINDOWPATH): NEGATIVE

## 2017-06-20 LAB — HEPATITIS C ANTIBODY: Hep C Virus Ab: >11 s/co ratio — ABNORMAL HIGH (ref 0.0–0.9)

## 2017-06-20 LAB — HEPATITIS B SURFACE ANTIGEN: Hepatitis B Surface Ag: NEGATIVE

## 2017-06-20 LAB — RPR: RPR Ser Ql: NONREACTIVE

## 2017-06-20 LAB — HIV ANTIBODY (ROUTINE TESTING W REFLEX): HIV Screen 4th Generation wRfx: NONREACTIVE

## 2017-06-20 NOTE — Addendum Note (Signed)
Addended by: Cheree DittoGRAHAM, Marea Reasner A on: 06/20/2017 11:29 AM   Modules accepted: Orders

## 2017-06-20 NOTE — Addendum Note (Signed)
Addended by: Cheree DittoGRAHAM, DEMETRICE A on: 06/20/2017 03:08 PM   Modules accepted: Orders

## 2017-06-21 ENCOUNTER — Telehealth: Payer: Self-pay

## 2017-06-21 ENCOUNTER — Other Ambulatory Visit (INDEPENDENT_AMBULATORY_CARE_PROVIDER_SITE_OTHER): Payer: Medicaid Other

## 2017-06-21 VITALS — BP 117/73 | HR 98

## 2017-06-21 DIAGNOSIS — Z202 Contact with and (suspected) exposure to infections with a predominantly sexual mode of transmission: Secondary | ICD-10-CM | POA: Diagnosis not present

## 2017-06-21 DIAGNOSIS — B1710 Acute hepatitis C without hepatic coma: Secondary | ICD-10-CM

## 2017-06-21 MED ORDER — AZITHROMYCIN 250 MG PO TABS
1000.0000 mg | ORAL_TABLET | Freq: Every day | ORAL | Status: DC
  Administered 2017-06-21: 1000 mg via ORAL

## 2017-06-21 MED ORDER — AZITHROMYCIN 250 MG PO TABS: 500.0000 mg | ORAL_TABLET | Freq: Once | ORAL | Status: DC

## 2017-06-21 MED ORDER — CEFTRIAXONE SODIUM 500 MG IJ SOLR
250.0000 mg | Freq: Once | INTRAMUSCULAR | Status: AC
  Administered 2017-06-21: 250 mg via INTRAMUSCULAR

## 2017-06-21 NOTE — Progress Notes (Signed)
Patient tested positive for Gonorrhea. Patient given Rocephin in left gluteal 250 mg and Zithromax 1 gram in the office. Patient was ask to wait 15 minutes to make sure she did not have a reaction the medication. Patient site was check no signs of redness or swelling at this time. Patient stated she has not had any reaction to penicillin in the past.

## 2017-06-24 ENCOUNTER — Telehealth: Payer: Self-pay

## 2017-06-24 NOTE — Telephone Encounter (Signed)
Patient has been informed of recent test results and sti. She has been referred to Infectious Disease for hep C. Patient schedule to come to office for treatment of sti.

## 2017-06-26 ENCOUNTER — Ambulatory Visit: Payer: Self-pay | Admitting: Obstetrics and Gynecology

## 2017-06-26 ENCOUNTER — Encounter: Payer: Self-pay | Admitting: Obstetrics and Gynecology

## 2017-06-27 NOTE — Progress Notes (Signed)
Patient did not keep GYN appointment for 06/26/2017.  Corey Caulfield, Jr MD Attending [facility] (Faculty Practice)   

## 2017-06-27 NOTE — Telephone Encounter (Signed)
Enter in error

## 2017-07-08 ENCOUNTER — Encounter: Payer: Self-pay | Admitting: Internal Medicine

## 2017-07-19 ENCOUNTER — Other Ambulatory Visit: Payer: Self-pay

## 2017-07-29 ENCOUNTER — Other Ambulatory Visit (HOSPITAL_COMMUNITY)
Admission: RE | Admit: 2017-07-29 | Discharge: 2017-07-29 | Disposition: A | Discharge status: Home / Self Care | Payer: Medicaid Other | Source: Ambulatory Visit | Attending: Family Medicine | Admitting: Family Medicine

## 2017-07-29 ENCOUNTER — Encounter: Payer: Self-pay | Admitting: Family Medicine

## 2017-07-29 ENCOUNTER — Ambulatory Visit (INDEPENDENT_AMBULATORY_CARE_PROVIDER_SITE_OTHER): Payer: Medicaid Other | Admitting: Family Medicine

## 2017-07-29 VITALS — BP 119/78 | HR 72 | Wt 138.2 lb

## 2017-07-29 DIAGNOSIS — Z3202 Encounter for pregnancy test, result negative: Secondary | ICD-10-CM

## 2017-07-29 DIAGNOSIS — Z3042 Encounter for surveillance of injectable contraceptive: Secondary | ICD-10-CM | POA: Diagnosis not present

## 2017-07-29 DIAGNOSIS — Z113 Encounter for screening for infections with a predominantly sexual mode of transmission: Secondary | ICD-10-CM | POA: Insufficient documentation

## 2017-07-29 DIAGNOSIS — B182 Chronic viral hepatitis C: Secondary | ICD-10-CM

## 2017-07-29 DIAGNOSIS — A599 Trichomoniasis, unspecified: Secondary | ICD-10-CM | POA: Diagnosis not present

## 2017-07-29 LAB — POCT URINE PREGNANCY: Preg Test, Ur: NEGATIVE

## 2017-07-29 MED ORDER — MEDROXYPROGESTERONE ACETATE 150 MG/ML IM SUSP
150.0000 mg | Freq: Once | INTRAMUSCULAR | Status: AC
  Administered 2017-07-29: 150 mg via INTRAMUSCULAR

## 2017-07-29 NOTE — Progress Notes (Signed)
Desire depo injection

## 2017-07-29 NOTE — Progress Notes (Signed)
    Subjective:    Patient ID: Leah Greene is a 22 y.o. female presenting with Discuss depo-injection and STD testing  on 07/29/2017  HPI: [name] returns today for contraception. LMP 07/11/17. Notes intercourse on 3/29, with no condom, though he did not ejaculate in her. She definitely wants Depo today. Also, previously tested positive for GC and needs TOC. Wants HIV testing. Previously with + hep C--has not had confirmatory testing. Reports that she is clean from drugs now 3.5 months from heroin and 2.5 months from cocaine. Working 2 jobs, [place] to [place] where she is living with her boyfriend who is not a drug user.  Review of Systems  Constitutional: Negative for chills and fever.  Respiratory: Negative for shortness of breath.   Cardiovascular: Negative for chest pain.  Gastrointestinal: Negative for abdominal pain, nausea and vomiting.  Genitourinary: Negative for dysuria.  Skin: Negative for rash.      Objective:    BP 119/78   Pulse 72   Wt 138 lb 3.2 oz (62.7 kg)   LMP 06/28/2017   BMI 23.72 kg/m  Physical Exam  Constitutional: She is oriented to person, place, and time. She appears well-developed and well-nourished. No distress.  HENT:  Head: Normocephalic and atraumatic.  Eyes: No scleral icterus.  Neck: Neck supple.  Cardiovascular: Normal rate.  Pulmonary/Chest: Effort normal.  Abdominal: Soft.  Neurological: She is alert and oriented to person, place, and time.  Skin: Skin is warm and dry.  Psychiatric: She has a normal mood and affect.      Assessment & Plan:   Problem List Items Addressed This Visit    None    Visit Diagnoses    Screen for STD (sexually transmitted disease)    -  Primary   TOC for GC today   Relevant Orders   Cervicovaginal ancillary only   Ambulatory referral to Infectious Disease   Hepatitis B Surface AntiGEN   HIV antibody   HCV RNA quant   Chronic hepatitis C without hepatic coma (HCC)       Check confirmatory  testing and refer to [facility]   Relevant Orders   Hepatitis B Surface AntiGEN   HIV antibody   HCV RNA quant   Encounter for Depo-Provera contraception       UPT negative today--return for UPT in 2 wks. Advised if pregnant, risks of concurrent Depo use.   Relevant Orders   POCT urine pregnancy (Completed)      Total face-to-face time with patient: 15 minutes. Over 50% of encounter was spent on counseling and coordination of care. Return in about 3 months (around 10/28/2017), or if symptoms worsen or fail to improve, for Depo Provera.  Reva Boresanya S Shamyia Grandpre 07/29/2017 2:46 PM

## 2017-07-30 LAB — HEPATITIS B SURFACE ANTIGEN: HEP B S AG: NEGATIVE

## 2017-07-30 LAB — HIV ANTIBODY (ROUTINE TESTING W REFLEX): HIV SCREEN 4TH GENERATION: NONREACTIVE

## 2017-07-30 LAB — CERVICOVAGINAL ANCILLARY ONLY
Bacterial vaginitis: POSITIVE — AB
CHLAMYDIA, DNA PROBE: NEGATIVE
Candida vaginitis: NEGATIVE
NEISSERIA GONORRHEA: NEGATIVE
Trichomonas: POSITIVE — AB

## 2017-07-30 LAB — HCV RNA QUANT
HCV log10: 5.215 log10 IU/mL
Hepatitis C Quantitation: 164000 IU/mL

## 2017-08-01 ENCOUNTER — Other Ambulatory Visit: Payer: Self-pay

## 2017-08-01 MED ORDER — METRONIDAZOLE 500 MG PO TABS: 1000.0000 mg | ORAL_TABLET | Freq: Two times a day (BID) | ORAL | 1 refills | Status: AC

## 2017-08-01 NOTE — Addendum Note (Signed)
Addended by: Reva BoresPRATT, Tera Pellicane S on: 08/01/2017 08:29 AM   Modules accepted: Orders

## 2017-08-12 ENCOUNTER — Telehealth: Payer: Self-pay | Admitting: *Deleted

## 2017-08-12 DIAGNOSIS — A599 Trichomoniasis, unspecified: Secondary | ICD-10-CM

## 2017-08-12 DIAGNOSIS — N760 Acute vaginitis: Secondary | ICD-10-CM

## 2017-08-12 DIAGNOSIS — B9689 Other specified bacterial agents as the cause of diseases classified elsewhere: Secondary | ICD-10-CM

## 2017-08-12 MED ORDER — METRONIDAZOLE 500 MG PO TABS: 500.0000 mg | ORAL_TABLET | Freq: Two times a day (BID) | ORAL | 1 refills | Status: DC

## 2017-08-12 NOTE — Telephone Encounter (Signed)
Called pt and LM for her to rtn call. Sent in Flagyl per protocol.

## 2017-08-12 NOTE — Telephone Encounter (Signed)
-----   Message from Reva Boresanya S Pratt, MD sent at 08/01/2017  8:29 AM EDT ----- Has BV/trich--rx sent in. Partner needs treatment.

## 2017-09-12 ENCOUNTER — Other Ambulatory Visit: Payer: Self-pay

## 2017-10-18 ENCOUNTER — Encounter: Payer: Self-pay | Admitting: Obstetrics & Gynecology

## 2017-10-18 ENCOUNTER — Ambulatory Visit (INDEPENDENT_AMBULATORY_CARE_PROVIDER_SITE_OTHER): Payer: Medicaid Other | Admitting: Obstetrics & Gynecology

## 2017-10-18 VITALS — BP 106/71 | HR 72 | Wt 128.4 lb

## 2017-10-18 DIAGNOSIS — N6320 Unspecified lump in the left breast, unspecified quadrant: Secondary | ICD-10-CM | POA: Diagnosis not present

## 2017-10-18 DIAGNOSIS — Z3042 Encounter for surveillance of injectable contraceptive: Secondary | ICD-10-CM

## 2017-10-18 DIAGNOSIS — B182 Chronic viral hepatitis C: Secondary | ICD-10-CM | POA: Diagnosis not present

## 2017-10-18 MED ORDER — MEDROXYPROGESTERONE ACETATE 150 MG/ML IM SUSP
150.0000 mg | Freq: Once | INTRAMUSCULAR | Status: AC
  Administered 2017-10-18: 150 mg via INTRAMUSCULAR

## 2017-10-18 NOTE — Progress Notes (Signed)
   Subjective:    Patient ID: Leah Greene, female    DOB: [birth year], 22 y.o.   MRN: [id number]  HPI 22 yo single P2 (2 and 965 yo kids) here for depo provera injection and to report a 5+ month h/o left breast pain and a "lump". Her mom and maternal aunt have breast cancer. She has a h/o injecting cocaine into the medial aspects of her breasts and has had abscesses in both breasts. She reports no drug use since 11/18.   Review of Systems     Objective:   Physical Exam  Breathing, conversing, and ambulating normally Well nourished, well hydrated White female, no apparent distress Abd- benign Breasts show scarring from previous abscesses. I cannot feel any masses of either breasts      Assessment & Plan:  Left breast pain and subjetive lump- rec breast u/s Contraception- continue depo provera Hep C- check LFTs and refer to ID

## 2017-10-19 LAB — HEPATIC FUNCTION PANEL
ALT: 50 IU/L — AB (ref 0–32)
AST: 35 IU/L (ref 0–40)
Albumin: 4.3 g/dL (ref 3.5–5.5)
Alkaline Phosphatase: 48 IU/L (ref 39–117)
BILIRUBIN, DIRECT: 0.15 mg/dL (ref 0.00–0.40)
Bilirubin Total: 0.5 mg/dL (ref 0.0–1.2)
Total Protein: 7 g/dL (ref 6.0–8.5)

## 2017-10-24 ENCOUNTER — Other Ambulatory Visit (HOSPITAL_COMMUNITY): Payer: Self-pay | Admitting: Obstetrics & Gynecology

## 2017-10-28 ENCOUNTER — Other Ambulatory Visit: Payer: Self-pay

## 2017-12-23 ENCOUNTER — Other Ambulatory Visit: Payer: Self-pay

## 2017-12-25 ENCOUNTER — Other Ambulatory Visit (HOSPITAL_COMMUNITY)
Admission: RE | Admit: 2017-12-25 | Discharge: 2017-12-25 | Disposition: A | Discharge status: Home / Self Care | Payer: Medicaid Other | Source: Ambulatory Visit | Attending: Family Medicine | Admitting: Family Medicine

## 2017-12-25 ENCOUNTER — Ambulatory Visit (INDEPENDENT_AMBULATORY_CARE_PROVIDER_SITE_OTHER): Payer: Medicaid Other | Admitting: Family Medicine

## 2017-12-25 ENCOUNTER — Encounter: Payer: Self-pay | Admitting: Family Medicine

## 2017-12-25 VITALS — BP 129/78 | HR 96 | Wt 136.6 lb

## 2017-12-25 DIAGNOSIS — O09212 Supervision of pregnancy with history of pre-term labor, second trimester: Secondary | ICD-10-CM

## 2017-12-25 DIAGNOSIS — O0992 Supervision of high risk pregnancy, unspecified, second trimester: Secondary | ICD-10-CM

## 2017-12-25 DIAGNOSIS — O09899 Supervision of other high risk pregnancies, unspecified trimester: Secondary | ICD-10-CM

## 2017-12-25 DIAGNOSIS — Z3A25 25 weeks gestation of pregnancy: Secondary | ICD-10-CM | POA: Diagnosis not present

## 2017-12-25 DIAGNOSIS — O99330 Smoking (tobacco) complicating pregnancy, unspecified trimester: Secondary | ICD-10-CM

## 2017-12-25 DIAGNOSIS — F1911 Other psychoactive substance abuse, in remission: Secondary | ICD-10-CM | POA: Insufficient documentation

## 2017-12-25 DIAGNOSIS — O0990 Supervision of high risk pregnancy, unspecified, unspecified trimester: Secondary | ICD-10-CM | POA: Diagnosis not present

## 2017-12-25 DIAGNOSIS — B182 Chronic viral hepatitis C: Secondary | ICD-10-CM

## 2017-12-25 DIAGNOSIS — O98419 Viral hepatitis complicating pregnancy, unspecified trimester: Secondary | ICD-10-CM | POA: Insufficient documentation

## 2017-12-25 DIAGNOSIS — O98412 Viral hepatitis complicating pregnancy, second trimester: Secondary | ICD-10-CM

## 2017-12-25 DIAGNOSIS — Z87898 Personal history of other specified conditions: Secondary | ICD-10-CM

## 2017-12-25 DIAGNOSIS — O09219 Supervision of pregnancy with history of pre-term labor, unspecified trimester: Secondary | ICD-10-CM

## 2017-12-25 DIAGNOSIS — O99332 Smoking (tobacco) complicating pregnancy, second trimester: Secondary | ICD-10-CM

## 2017-12-25 DIAGNOSIS — Z609 Problem related to social environment, unspecified: Secondary | ICD-10-CM

## 2017-12-25 NOTE — Progress Notes (Signed)
   PRENATAL VISIT NOTE  Subjective:  Leah Greene is a 22 y.o. [phone] at 5566w5d being seen today for ongoing prenatal care.  She is currently monitored for the following issues for this high-risk pregnancy and has ODD (oppositional defiant disorder); ADHD (attention deficit hyperactivity disorder), combined type; PTSD (post-traumatic stress disorder); Cannabis use disorder, moderate, dependence (HCC); Family history of breast cancer in first degree relative; Bipolar I disorder, most recent episode manic, severe without psychotic features (HCC); MDD (major depressive disorder), recurrent episode, severe (HCC); Supervision of high risk pregnancy, antepartum; History of drug abuse; Tobacco use affecting pregnancy, antepartum; Chronic hepatitis C affecting pregnancy, antepartum (HCC); Hepatitis C, chronic (HCC); History of preterm delivery, currently pregnant; and High risk social situation on their problem list.  Patient reports no complaints.  Contractions: Not present. Vag. Bleeding: None.  Movement: Present. Denies leaking of fluid.   The following portions of the patient's history were reviewed and updated as appropriate: allergies, current medications, past family history, past medical history, past social history, past surgical history and problem list. Problem list updated.  Objective:   Vitals:   12/25/17 1530  BP: 129/78  Pulse: 96  Weight: 136 lb 9.6 oz (62 kg)    Fetal Status:     Movement: Present     General:  Alert, oriented and cooperative. Patient is in no acute distress.  Skin: Skin is warm and dry. No rash noted.   Cardiovascular: Normal heart rate noted  Respiratory: Normal respiratory effort, no problems with respiration noted  Abdomen: Soft, gravid, appropriate for gestational age.  Pain/Pressure: Absent     Pelvic: Cervical exam deferred        Extremities: Normal range of motion.  Edema: None  Mental Status: Normal mood and affect. Normal behavior. Normal judgment  and thought content.           Assessment and Plan:  Pregnancy: [id number] at 2966w5d   1. Supervision of high risk pregnancy, antepartum - Reviewed practice model  - Discussed risk factors and drug use specficially - Referral to OBCM given preterm birth and high risk social situation (lost custody of 2 children, prior drug abuse history, ?sex trade per prior report) - Obstetric Panel, Including HIV - Cytology - PAP - ToxASSURE Select 13 (MW), Urine - Culture, OB Urine - US MFM OB COMP + 14 WK; Future - Bedside US today showed BPD and femur length consistent with LMP. + fetal movement and good waveform FHR.   2. History of drug abuse Currently "clean and sober" -- last used Heroin in Nov 2018 and last cocaine in March 2019. Not in NA or other support program and reports "I got this and I am not going to use" -UDS today  3. Chronic hepatitis C complicating pregnancy, antepartum (HCC) - Hepatitis C RNA quantitative today - LFTs - Referral to hepatology after pregnancy completed  4. Tobacco use affecting pregnancy, antepartum Continues to smoke  5. History of preterm birth - too late for 3417 P (currently ~25 wks)  Preterm labor symptoms and general obstetric precautions including but not limited to vaginal bleeding, contractions, leaking of fluid and fetal movement were reviewed in detail with the patient. Please refer to After Visit Summary for other counseling recommendations.   Return in about 4 weeks (around 01/22/2018) for Routine prenatal care.  Future Appointments  Date Time Provider Department Center  01/22/2018  3:00 PM Federico FlakeNewton, Kauri Garson Niles, MD [facility]    Federico FlakeKimberly Niles Mustafa Potts, MD

## 2017-12-26 LAB — OBSTETRIC PANEL, INCLUDING HIV
BASOS: 0 %
Basophils Absolute: 0 10*3/uL (ref 0.0–0.2)
EOS (ABSOLUTE): 0.1 10*3/uL (ref 0.0–0.4)
Eos: 1 %
HEMATOCRIT: 32.8 % — AB (ref 34.0–46.6)
HIV SCREEN 4TH GENERATION: NONREACTIVE
Hemoglobin: 11.4 g/dL (ref 11.1–15.9)
Hepatitis B Surface Ag: NEGATIVE
Immature Grans (Abs): 0.1 10*3/uL (ref 0.0–0.1)
Immature Granulocytes: 1 %
LYMPHS ABS: 2.3 10*3/uL (ref 0.7–3.1)
Lymphs: 23 %
MCH: 30.4 pg (ref 26.6–33.0)
MCHC: 34.8 g/dL (ref 31.5–35.7)
MCV: 88 fL (ref 79–97)
MONOCYTES: 5 %
MONOS ABS: 0.5 10*3/uL (ref 0.1–0.9)
NEUTROS ABS: 7.2 10*3/uL — AB (ref 1.4–7.0)
Neutrophils: 70 %
PLATELETS: 246 10*3/uL (ref 150–450)
RBC: 3.75 x10E6/uL — ABNORMAL LOW (ref 3.77–5.28)
RDW: 13.4 % (ref 12.3–15.4)
RPR Ser Ql: NONREACTIVE
RUBELLA: 2.09 {index} (ref >0.99)
Rh Factor: POSITIVE
WBC: 10.2 10*3/uL (ref 3.4–10.8)

## 2017-12-26 LAB — HEPATIC FUNCTION PANEL
ALK PHOS: 82 IU/L (ref 39–117)
ALT: 27 IU/L (ref 0–32)
AST: 24 IU/L (ref 0–40)
Albumin: 3.7 g/dL (ref 3.5–5.5)
Bilirubin Total: 0.4 mg/dL (ref 0.0–1.2)
Bilirubin, Direct: 0.09 mg/dL (ref 0.00–0.40)
Total Protein: 6.4 g/dL (ref 6.0–8.5)

## 2017-12-26 LAB — AB SCR+ANTIBODY ID

## 2017-12-26 LAB — HEPATITIS C ANTIBODY

## 2017-12-26 NOTE — Addendum Note (Signed)
Addended by: Areta HaberMOREHEAD, Palmer Fahrner B on: 12/26/2017 10:46 AM   Modules accepted: Orders

## 2017-12-28 LAB — URINE CULTURE, OB REFLEX

## 2017-12-28 LAB — CULTURE, OB URINE

## 2017-12-29 LAB — TOXASSURE SELECT 13 (MW), URINE

## 2017-12-31 ENCOUNTER — Telehealth: Payer: Self-pay | Admitting: Radiology

## 2017-12-31 LAB — CYTOLOGY - PAP
Chlamydia: NEGATIVE
Diagnosis: NEGATIVE
Neisseria Gonorrhea: NEGATIVE
TRICH (WINDOWPATH): POSITIVE — AB

## 2017-12-31 NOTE — Telephone Encounter (Signed)
Patient called into for Lab results, I explained to the patient that I have contacted her twice and left two voice mails that the patient needs to be seen at [facility] for an ultrasound. I expressed to the patient that she needs to contact MFM to schedule her Complete + 14 wk scan, patent's schedule is undetermined so I suggested that she call and schedule herself. Provided phone number and expressed importance. Patient expresses understanding and states that she will contact MFM to schedule appointment.

## 2018-01-01 ENCOUNTER — Encounter: Payer: Self-pay | Admitting: Family Medicine

## 2018-01-01 ENCOUNTER — Telehealth: Payer: Self-pay

## 2018-01-01 DIAGNOSIS — O99321 Drug use complicating pregnancy, first trimester: Secondary | ICD-10-CM

## 2018-01-01 DIAGNOSIS — F1490 Cocaine use, unspecified, uncomplicated: Secondary | ICD-10-CM | POA: Insufficient documentation

## 2018-01-01 DIAGNOSIS — O23592 Infection of other part of genital tract in pregnancy, second trimester: Principal | ICD-10-CM

## 2018-01-01 DIAGNOSIS — A5901 Trichomonal vulvovaginitis: Secondary | ICD-10-CM | POA: Insufficient documentation

## 2018-01-01 MED ORDER — CLINDAMYCIN PHOSPHATE 1 % EX GEL: Freq: Two times a day (BID) | CUTANEOUS | 0 refills | Status: AC

## 2018-01-01 MED ORDER — ONDANSETRON HCL 4 MG PO TABS: 4.0000 mg | ORAL_TABLET | Freq: Three times a day (TID) | ORAL | 0 refills | Status: AC | PRN

## 2018-01-01 NOTE — Telephone Encounter (Signed)
Reviewed CMA documentation.  Will trial clindamycin gel given IGE mediated allergy to metronidazole and unlikely able to perform desensitization in pregnancy.  Recommend TOC in 4 weeks Recommend all partners are treated

## 2018-01-01 NOTE — Telephone Encounter (Signed)
Patient called in regarding lab results - gave pharmacy information:   [facility] [place]. Patient was advised by Herbert Seta antibiotic and nausea medication would be electronically sent in to the pharmacy - she will need to contact the pharmacy for when the prescriptions are ready for pick up.   Per provider - electronically sent in:   Zofran 4 mg PO every 8 hours PRN #30 RF 0  Called patient  - per provider - advised of the above antibiotic and nausea medications being [facility].   Patient reports she is allergic to Flagyl - Rash and facial swelling. I advised this medication was not on her allergies list and that I would add it to her chart. Patient stated she thought she had told us.  Called patient - advised due to her medication allergies this message will be routed to Dr. Alvester Morin for review.    Patient stated she understood what medications was being called in and the instructions - she had no other questions at this time.

## 2018-01-01 NOTE — Telephone Encounter (Signed)
Patient called back in - advised of provider notations - Patient stated she understood and had no other questions.

## 2018-01-06 LAB — SMN1 COPY NUMBER ANALYSIS (SMA CARRIER SCREENING)

## 2018-01-06 LAB — HEMOGLOBINOPATHY EVALUATION
HEMOGLOBIN A2 QUANTITATION: 3.1 % (ref 1.8–3.2)
HGB C: 0 %
HGB S: 0 %
HGB VARIANT: 0 %
Hemoglobin F Quantitation: 0 % (ref 0.0–2.0)
Hgb A: 96.9 % (ref 96.4–98.8)

## 2018-01-06 LAB — CYSTIC FIBROSIS MUTATION 97: Interpretation: NOT DETECTED

## 2018-01-08 ENCOUNTER — Ambulatory Visit (HOSPITAL_COMMUNITY)
Admission: RE | Admit: 2018-01-08 | Discharge: 2018-01-08 | Disposition: A | Discharge status: Home / Self Care | Payer: Medicaid Other | Source: Ambulatory Visit | Attending: Family Medicine | Admitting: Family Medicine

## 2018-01-08 DIAGNOSIS — O0992 Supervision of high risk pregnancy, unspecified, second trimester: Secondary | ICD-10-CM | POA: Insufficient documentation

## 2018-01-08 DIAGNOSIS — Z3A27 27 weeks gestation of pregnancy: Secondary | ICD-10-CM | POA: Diagnosis not present

## 2018-01-08 DIAGNOSIS — O0990 Supervision of high risk pregnancy, unspecified, unspecified trimester: Secondary | ICD-10-CM | POA: Diagnosis present

## 2018-01-08 DIAGNOSIS — Z363 Encounter for antenatal screening for malformations: Secondary | ICD-10-CM

## 2018-01-09 LAB — HCV RNA BY PCR, QN RFX GENO
HCV Quant Baseline: 159000 IU/mL
HCV log10: 5.201 log10 IU/mL

## 2018-01-09 LAB — HEPATITIS C GENOTYPE REFLEX

## 2018-01-09 LAB — HEPATITIS C GENOTYPE

## 2018-01-22 ENCOUNTER — Encounter: Payer: Self-pay | Admitting: Family Medicine

## 2018-02-03 ENCOUNTER — Encounter (HOSPITAL_COMMUNITY): Payer: Self-pay

## 2018-02-03 ENCOUNTER — Other Ambulatory Visit (HOSPITAL_COMMUNITY): Payer: Self-pay | Admitting: *Deleted

## 2018-02-03 DIAGNOSIS — Z3689 Encounter for other specified antenatal screening: Secondary | ICD-10-CM

## 2018-02-05 ENCOUNTER — Ambulatory Visit (HOSPITAL_COMMUNITY): Admission: RE | Admit: 2018-02-05 | Payer: Medicaid - Out of State | Source: Ambulatory Visit

## 2018-02-05 HISTORY — DX: Other psychoactive substance abuse, uncomplicated: F19.10

## 2018-02-18 ENCOUNTER — Telehealth: Payer: Self-pay | Admitting: Radiology

## 2018-02-18 NOTE — Telephone Encounter (Signed)
Left message for patient to call [facility], have not seen her in the office since 12/25/17, " No Show" on 01/22/18. Needed to scheduled OB f/u.

## 2018-06-27 ENCOUNTER — Encounter (HOSPITAL_COMMUNITY): Payer: Self-pay

## 2020-05-09 ENCOUNTER — Emergency Department (HOSPITAL_BASED_OUTPATIENT_CLINIC_OR_DEPARTMENT_OTHER)
Admission: EM | Admit: 2020-05-09 | Discharge: 2020-05-09 | Disposition: A | Discharge status: Home / Self Care | Payer: Medicaid Other | Attending: Emergency Medicine | Admitting: Emergency Medicine

## 2020-05-09 ENCOUNTER — Emergency Department (HOSPITAL_BASED_OUTPATIENT_CLINIC_OR_DEPARTMENT_OTHER): Payer: Medicaid Other

## 2020-05-09 ENCOUNTER — Other Ambulatory Visit: Payer: Self-pay

## 2020-05-09 DIAGNOSIS — H53141 Visual discomfort, right eye: Secondary | ICD-10-CM | POA: Insufficient documentation

## 2020-05-09 DIAGNOSIS — N939 Abnormal uterine and vaginal bleeding, unspecified: Secondary | ICD-10-CM | POA: Insufficient documentation

## 2020-05-09 DIAGNOSIS — Z5321 Procedure and treatment not carried out due to patient leaving prior to being seen by health care provider: Secondary | ICD-10-CM | POA: Insufficient documentation

## 2020-05-09 DIAGNOSIS — R519 Headache, unspecified: Secondary | ICD-10-CM | POA: Diagnosis not present

## 2020-05-09 LAB — URINALYSIS, ROUTINE W REFLEX MICROSCOPIC
Bilirubin Urine: NEGATIVE
Glucose, UA: NEGATIVE mg/dL
Ketones, ur: NEGATIVE mg/dL
Leukocytes,Ua: NEGATIVE
Nitrite: NEGATIVE
Protein, ur: NEGATIVE mg/dL
Specific Gravity, Urine: 1.015 (ref 1.005–1.030)
pH: 6.5 (ref 5.0–8.0)

## 2020-05-09 LAB — URINALYSIS, MICROSCOPIC (REFLEX): WBC, UA: NONE SEEN WBC/hpf (ref 0–5)

## 2020-05-09 LAB — PREGNANCY, URINE: Preg Test, Ur: NEGATIVE

## 2020-05-09 NOTE — ED Notes (Signed)
Pt states she cannot wait any longer and would rather come back tomorrow morning. Pt explained to that it is possible there could still be a wait tomorrow morning and that we are trying to bring her back as soon as possible. Pt understands but says she will leave and come back in the morning.
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
## 2022-07-23 ENCOUNTER — Emergency Department (HOSPITAL_BASED_OUTPATIENT_CLINIC_OR_DEPARTMENT_OTHER)
Admission: EM | Admit: 2022-07-23 | Discharge: 2022-07-23 | Disposition: A | Discharge status: Home / Self Care | Payer: 59 | Attending: Emergency Medicine | Admitting: Emergency Medicine

## 2022-07-23 ENCOUNTER — Other Ambulatory Visit: Payer: Self-pay

## 2022-07-23 ENCOUNTER — Encounter (HOSPITAL_BASED_OUTPATIENT_CLINIC_OR_DEPARTMENT_OTHER): Payer: Self-pay | Admitting: Emergency Medicine

## 2022-07-23 DIAGNOSIS — H9201 Otalgia, right ear: Secondary | ICD-10-CM | POA: Diagnosis not present

## 2022-07-23 DIAGNOSIS — Z91040 Latex allergy status: Secondary | ICD-10-CM | POA: Diagnosis not present

## 2022-07-23 DIAGNOSIS — R0981 Nasal congestion: Secondary | ICD-10-CM | POA: Diagnosis not present

## 2022-07-23 MED ORDER — FLUTICASONE PROPIONATE 50 MCG/ACT NA SUSP
1.0000 | Freq: Every day | NASAL | 2 refills | Status: AC
Start: 2022-07-23 — End: ?

## 2022-07-23 MED ORDER — OFLOXACIN 0.3 % OT SOLN: 5.0000 [drp] | Freq: Two times a day (BID) | OTIC | 0 refills | Status: AC

## 2022-07-23 NOTE — ED Triage Notes (Signed)
Pt via pov from home with nasal congestion and clogged ears since Saturday. Denies fevers or chills. Reports that it hurts to swallow. Pt believes it may be seasonal allergies; states she doesn't believe she is sick but that it is due to pollen. Declines covid and strep testing until she sees provider. Pt has not taken any otc medications for allergies. NAD noted.

## 2022-07-23 NOTE — ED Provider Notes (Signed)
[facility] Provider Note   CSN: [place]:[id number] Arrival date & time: 07/23/22  1103     History  Chief Complaint  Patient presents with   Nasal Congestion    Leah Greene is a 27 y.o. female.  HPI   This is a 54 female presenting to the emergency department due to right ear pain.  Started 2 days ago initially with nasal congestion, she has been having decreased hearing and pain to the right ear since then.  Tried using a Q-tip, this did not help.  Unable to hear out of the ear.  Also having clear drainage from the ear.  No fevers or chills.  Home Medications Prior to Admission medications   Medication Sig Start Date End Date Taking? Authorizing Provider  fluticasone (FLONASE) 50 MCG/ACT nasal spray Place 1 spray into both nostrils daily. 07/23/22  Yes Sherrill Raring, PA-C  ofloxacin (FLOXIN) 0.3 % OTIC solution Place 5 drops into the right ear 2 (two) times daily. 07/23/22  Yes Sherrill Raring, PA-C  ondansetron (ZOFRAN) 4 MG tablet Take 1 tablet (4 mg total) by mouth every 8 (eight) hours as needed for nausea or vomiting. 01/01/18   Caren Macadam, MD      Allergies    Flagyl [metronidazole], Amoxicillin, Depakote [divalproex sodium], Promethazine, Fluconazole, Latex, Penicillins, and Vancomycin    Review of Systems   Review of Systems  Physical Exam Updated Vital Signs BP 110/74   Pulse 74   Temp 98.5 F (36.9 C)   Resp 14   Ht 5' 4.5" (1.638 m)   Wt 68 kg   LMP 07/16/2022 (Approximate)   SpO2 98%   BMI 25.35 kg/m  Physical Exam Vitals and nursing note reviewed. Exam conducted with a chaperone present.  Constitutional:      General: She is not in acute distress.    Appearance: Normal appearance.  HENT:     Head: Normocephalic and atraumatic.     Left Ear: Tympanic membrane normal.     Ears:     Comments: Right ear without mastoid swelling.  The external ear canal was erythematous with erosions and fluid.  TM is intact  without perforation.  No tragal tenderness    Nose: Congestion present.     Mouth/Throat:     Pharynx: Posterior oropharyngeal erythema present.     Comments: Postnasal drip Eyes:     General: No scleral icterus.    Extraocular Movements: Extraocular movements intact.     Pupils: Pupils are equal, round, and reactive to light.  Skin:    Coloration: Skin is not jaundiced.  Neurological:     Mental Status: She is alert. Mental status is at baseline.     Coordination: Coordination normal.     ED Results / Procedures / Treatments   Labs (all labs ordered are listed, but only abnormal results are displayed) Labs Reviewed - No data to display  EKG None  Radiology No results found.  Procedures Procedures    Medications Ordered in ED Medications - No data to display  ED Course/ Medical Decision Making/ A&P                             Medical Decision Making Risk Prescription drug management.   Patient presents due to allergies and ear pain.  There is no mastoid swelling, no erythema over the mastoid process and I do not think this is mastoiditis.  TM is clear without perforation or signs of AOM.  There is some erythema and fluid in the external ear canal concerning for possible otitis externa?.  Doubt suspect malignant otitis externa.  There is also postnasal drip so there could be an element of eustachian tube dysfunction.  Will cover with Cipro otic drops, ENT follow-up and Flonase for the congestion.  She is stable for outpatient follow-up at this time.        Final Clinical Impression(s) / ED Diagnoses Final diagnoses:  Right ear pain  Nasal congestion    Rx / DC Orders ED Discharge Orders          Ordered    ofloxacin (FLOXIN) 0.3 % OTIC solution  2 times daily        07/23/22 1220    fluticasone (FLONASE) 50 MCG/ACT nasal spray  Daily        07/23/22 1220              Sherrill Raring, PA-C 07/23/22 [place], [place], DO 07/24/22 [phone]

## 2022-07-23 NOTE — Discharge Instructions (Signed)
You are seen today in the emergency department for earache and nasal congestion.  Use the otic drops as prescribed, use Flonase in each nare.  Call and schedule an appointment with ear nose and throat next week for evaluation.  Return to the ED for new or concerning symptoms.

## 2022-07-24 ENCOUNTER — Telehealth (HOSPITAL_BASED_OUTPATIENT_CLINIC_OR_DEPARTMENT_OTHER): Payer: Self-pay | Admitting: Emergency Medicine

## 2022-08-15 ENCOUNTER — Encounter (HOSPITAL_BASED_OUTPATIENT_CLINIC_OR_DEPARTMENT_OTHER): Payer: Self-pay

## 2022-08-15 ENCOUNTER — Emergency Department (HOSPITAL_BASED_OUTPATIENT_CLINIC_OR_DEPARTMENT_OTHER)
Admission: EM | Admit: 2022-08-15 | Discharge: 2022-08-15 | Disposition: A | Discharge status: Home / Self Care | Payer: 59 | Attending: Emergency Medicine | Admitting: Emergency Medicine

## 2022-08-15 ENCOUNTER — Other Ambulatory Visit: Payer: Self-pay

## 2022-08-15 DIAGNOSIS — R1032 Left lower quadrant pain: Secondary | ICD-10-CM | POA: Insufficient documentation

## 2022-08-15 DIAGNOSIS — R109 Unspecified abdominal pain: Secondary | ICD-10-CM | POA: Diagnosis present

## 2022-08-15 DIAGNOSIS — R1031 Right lower quadrant pain: Secondary | ICD-10-CM | POA: Diagnosis not present

## 2022-08-15 DIAGNOSIS — R112 Nausea with vomiting, unspecified: Secondary | ICD-10-CM | POA: Insufficient documentation

## 2022-08-15 DIAGNOSIS — Z7951 Long term (current) use of inhaled steroids: Secondary | ICD-10-CM | POA: Insufficient documentation

## 2022-08-15 DIAGNOSIS — J45909 Unspecified asthma, uncomplicated: Secondary | ICD-10-CM | POA: Insufficient documentation

## 2022-08-15 DIAGNOSIS — Z91040 Latex allergy status: Secondary | ICD-10-CM | POA: Insufficient documentation

## 2022-08-15 LAB — URINALYSIS, ROUTINE W REFLEX MICROSCOPIC
Bilirubin Urine: NEGATIVE
Glucose, UA: NEGATIVE mg/dL
Hgb urine dipstick: NEGATIVE
Ketones, ur: 15 mg/dL — AB
Leukocytes,Ua: NEGATIVE
Nitrite: NEGATIVE
Specific Gravity, Urine: 1.035 — ABNORMAL HIGH (ref 1.005–1.030)
pH: 5.5 (ref 5.0–8.0)

## 2022-08-15 LAB — COMPREHENSIVE METABOLIC PANEL
ALT: 33 U/L (ref 0–44)
AST: 21 U/L (ref 15–41)
Albumin: 4.9 g/dL (ref 3.5–5.0)
Alkaline Phosphatase: 54 U/L (ref 38–126)
Anion gap: 11 (ref 5–15)
BUN: 14 mg/dL (ref 6–20)
CO2: 24 mmol/L (ref 22–32)
Calcium: 9.8 mg/dL (ref 8.9–10.3)
Chloride: 102 mmol/L (ref 98–111)
Creatinine, Ser: 0.59 mg/dL (ref 0.44–1.00)
GFR, Estimated: >60 mL/min (ref >60)
Glucose, Bld: 99 mg/dL (ref 70–99)
Potassium: 3.7 mmol/L (ref 3.5–5.1)
Sodium: 137 mmol/L (ref 135–145)
Total Bilirubin: 0.9 mg/dL (ref 0.3–1.2)
Total Protein: 8.2 g/dL — ABNORMAL HIGH (ref 6.5–8.1)

## 2022-08-15 LAB — CBC
HCT: 42.5 % (ref 36.0–46.0)
Hemoglobin: 14.4 g/dL (ref 12.0–15.0)
MCH: 29.8 pg (ref 26.0–34.0)
MCHC: 33.9 g/dL (ref 30.0–36.0)
MCV: 87.8 fL (ref 80.0–100.0)
Platelets: 276 10*3/uL (ref 150–400)
RBC: 4.84 MIL/uL (ref 3.87–5.11)
RDW: 13.5 % (ref 11.5–15.5)
WBC: 7.8 10*3/uL (ref 4.0–10.5)
nRBC: 0 % (ref 0.0–0.2)

## 2022-08-15 LAB — PREGNANCY, URINE: Preg Test, Ur: NEGATIVE

## 2022-08-15 LAB — LIPASE, BLOOD: Lipase: 14 U/L (ref 11–51)

## 2022-08-15 MED ORDER — ONDANSETRON HCL 4 MG/2ML IJ SOLN
4.0000 mg | Freq: Once | INTRAMUSCULAR | Status: DC | PRN
  Filled 2022-08-15: qty 2

## 2022-08-15 MED ORDER — LOPERAMIDE HCL 2 MG PO CAPS: 2.0000 mg | ORAL_CAPSULE | Freq: Four times a day (QID) | ORAL | 0 refills | Status: AC | PRN

## 2022-08-15 MED ORDER — NAPROXEN 500 MG PO TABS: 500.0000 mg | ORAL_TABLET | Freq: Two times a day (BID) | ORAL | 0 refills | Status: AC

## 2022-08-15 MED ORDER — ACETAMINOPHEN 325 MG PO TABS: 650.0000 mg | ORAL_TABLET | Freq: Four times a day (QID) | ORAL | 0 refills | Status: AC | PRN

## 2022-08-15 MED ORDER — ONDANSETRON 4 MG PO TBDP: 4.0000 mg | ORAL_TABLET | Freq: Three times a day (TID) | ORAL | 0 refills | Status: AC | PRN

## 2022-08-15 MED ORDER — KETOROLAC TROMETHAMINE 30 MG/ML IJ SOLN
30.0000 mg | Freq: Once | INTRAMUSCULAR | Status: AC
  Administered 2022-08-15: 30 mg via INTRAVENOUS
  Filled 2022-08-15: qty 1

## 2022-08-15 MED ORDER — ONDANSETRON HCL 4 MG/2ML IJ SOLN
4.0000 mg | Freq: Once | INTRAMUSCULAR | Status: AC
Start: 2022-08-15 — End: 2022-08-15
  Administered 2022-08-15: 4 mg via INTRAVENOUS
  Filled 2022-08-15: qty 2

## 2022-08-15 NOTE — Discharge Instructions (Addendum)
Please take your medications as prescribed. Take tylenol and naproxen for pain. Take imodium for diarrhea. I recommend close follow-up with PCP for reevaluation.  Please do not hesitate to return to emergency department if worrisome signs symptoms we discussed become apparent.

## 2022-08-15 NOTE — ED Notes (Signed)
Attempted to obtain IV access x2 and was unsuccessful.

## 2022-08-15 NOTE — ED Notes (Signed)
Pt aware of the need for a urine... Unable to currently provide a urine... 
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
## 2022-08-15 NOTE — ED Triage Notes (Signed)
Pt arrives to ED POV C/O Abdominal Pain N/V/D since 3am. Pt states she ate at [facility] and 2hrs later she began having N/V/D. No other complaints at this time. Pt A/O x4.

## 2022-08-15 NOTE — ED Notes (Signed)
Discharge paperwork given and verbally understood. 

## 2022-11-26 ENCOUNTER — Ambulatory Visit: Payer: Self-pay

## 2022-11-26 NOTE — Telephone Encounter (Signed)
  Chief Complaint: vaginal bleeding  Symptoms: severe  sharp abd pan, nausea, fatigue, dizziness, breast soreness Frequency: on and off since June Pertinent Negatives: Patient denies vomiting Disposition: [x] ED /[] Urgent Care (no appt availability in office) / [] Appointment(In office/virtual)/ []  [facility]/ [] Home Care/ [] Refused Recommended Disposition /[] [facility]/ []  Follow-up with PCP Additional Notes:  Reason for Disposition  SEVERE abdominal pain  Answer Assessment - Initial Assessment Questions 1. AMOUNT: "Describe the bleeding that you are having."    - SPOTTING: spotting, or pinkish / brownish mucous discharge; does not fill panty liner or pad    - MILD:  less than 1 pad / hour; less than patient's usual menstrual bleeding   - MODERATE: 1-2 pads / hour; 1 menstrual cup every 6 hours; small-medium blood clots (e.g., pea, grape, small coin)   - SEVERE: soaking 2 or more pads/hour for 2 or more hours; 1 menstrual cup every 2 hours; bleeding not contained by pads or continuous red blood from vagina; large blood clots (e.g., golf ball, large coin)      Yest mild no clots  bright red but light  2. ONSET: "When did the bleeding begin?" "Is it continuing now?"     Bleeding  3. MENSTRUAL PERIOD: "When was the last normal menstrual period?" "How is this different than your period?"     *No Answer* 4. REGULARITY: "How regular are your periods?"     *No Answer* 5. ABDOMEN PAIN: "Do you have any pain?" "How bad is the pain?"  (e.g., Scale 1-10; mild, moderate, or severe)   - MILD (1-3): doesn't interfere with normal activities, abdomen soft and not tender to touch    - MODERATE (4-7): interferes with normal activities or awakens from sleep, abdomen tender to touch    - SEVERE (8-10): excruciating pain, doubled over, unable to do any normal activities      Sharp cramping severe mild ctx 6. PREGNANCY: "Is there any chance you are pregnant?" "When was your last  menstrual period?"     *No Answer* 7. BREASTFEEDING: "Are you breastfeeding?"     N/a 8. HORMONE MEDICINES: "Are you taking any hormone medicines, prescription or over-the-counter?" (e.g., birth control pills, estrogen)     no 9. BLOOD THINNER MEDICINES: "Do you take any blood thinners?" (e.g., Coumadin / warfarin, Pradaxa / dabigatran, aspirin)     No  10. CAUSE: "What do you think is causing the bleeding?" (e.g., recent gyn surgery, recent gyn procedure; known bleeding disorder, cervical cancer, polycystic ovarian disease, fibroids)         *No Answer* 11. HEMODYNAMIC STATUS: "Are you weak or feeling lightheaded?" If Yes, ask: "Can you stand and walk normally?"        Yes /dizziness 12. OTHER SYMPTOMS: "What other symptoms are you having with the bleeding?" (e.g., passed tissue, vaginal discharge, fever, menstrual-type cramps)       Nausea in am , headaches, back, breast pain , SOB, fatigue  Protocols used: Vaginal Bleeding - Abnormal-A-AH

## 2022-11-27 ENCOUNTER — Emergency Department: Payer: 59

## 2022-11-27 ENCOUNTER — Other Ambulatory Visit: Payer: Self-pay

## 2022-11-27 ENCOUNTER — Encounter: Payer: Self-pay | Admitting: Emergency Medicine

## 2022-11-27 ENCOUNTER — Emergency Department
Admission: EM | Admit: 2022-11-27 | Discharge: 2022-11-27 | Disposition: A | Discharge status: Home / Self Care | Payer: 59 | Attending: Emergency Medicine | Admitting: Emergency Medicine

## 2022-11-27 DIAGNOSIS — N939 Abnormal uterine and vaginal bleeding, unspecified: Secondary | ICD-10-CM | POA: Diagnosis present

## 2022-11-27 LAB — CBC
HCT: 40 % (ref 36.0–46.0)
Hemoglobin: 13.4 g/dL (ref 12.0–15.0)
MCH: 29.6 pg (ref 26.0–34.0)
MCHC: 33.5 g/dL (ref 30.0–36.0)
MCV: 88.3 fL (ref 80.0–100.0)
Platelets: 289 10*3/uL (ref 150–400)
RBC: 4.53 MIL/uL (ref 3.87–5.11)
RDW: 12.8 % (ref 11.5–15.5)
WBC: 8.9 10*3/uL (ref 4.0–10.5)
nRBC: 0 % (ref 0.0–0.2)

## 2022-11-27 LAB — URINALYSIS, ROUTINE W REFLEX MICROSCOPIC
Bilirubin Urine: NEGATIVE
Glucose, UA: NEGATIVE mg/dL
Hgb urine dipstick: NEGATIVE
Ketones, ur: NEGATIVE mg/dL
Leukocytes,Ua: NEGATIVE
Nitrite: NEGATIVE
Protein, ur: NEGATIVE mg/dL
Specific Gravity, Urine: 1.029 (ref 1.005–1.030)
pH: 5 (ref 5.0–8.0)

## 2022-11-27 LAB — BASIC METABOLIC PANEL
Anion gap: 7 (ref 5–15)
BUN: 17 mg/dL (ref 6–20)
CO2: 24 mmol/L (ref 22–32)
Calcium: 8.9 mg/dL (ref 8.9–10.3)
Chloride: 106 mmol/L (ref 98–111)
Creatinine, Ser: 0.67 mg/dL (ref 0.44–1.00)
GFR, Estimated: >60 mL/min (ref >60)
Glucose, Bld: 84 mg/dL (ref 70–99)
Potassium: 3.6 mmol/L (ref 3.5–5.1)
Sodium: 137 mmol/L (ref 135–145)

## 2022-11-27 LAB — HCG, QUANTITATIVE, PREGNANCY: hCG, Beta Chain, Quant, S: 1 m[IU]/mL (ref <5)
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
## 2022-11-27 NOTE — ED Provider Notes (Signed)
[facility] Provider Note  Patient Contact: 7:53 PM (approximate)   History   Vaginal Bleeding   HPI  Leah Greene is a 27 y.o. female with a history of anxiety, ADHD, bipolar 1 disorder, PTSD, polysubstance abuse, presents to the emergency department with concern for possible ectopic pregnancy.  Patient states that she had a positive pregnancy test in early June and suspected that she had a miscarriage.  Patient states that she thought that she might be pregnant at home but has had multiple negative pregnancy test.  She states that she had a cycle for 3 days and has continued to have symptoms spotting and became concerned.  No fever or chills.  No chest pain, chest tightness or abdominal pain.      Physical Exam   Triage Vital Signs: ED Triage Vitals  Encounter Vitals Group     BP 11/27/22 1827 129/87     Systolic BP Percentile --      Diastolic BP Percentile --      Pulse Rate 11/27/22 1827 85     Resp 11/27/22 1827 18     Temp 11/27/22 1827 98.4 F (36.9 C)     Temp Source 11/27/22 1827 Oral     SpO2 11/27/22 1827 100 %     Weight 11/27/22 1830 147 lb 11.3 oz (67 kg)     Height 11/27/22 1830 5' 4.5" (1.638 m)     Head Circumference --      Peak Flow --      Pain Score 11/27/22 1829 7     Pain Loc --      Pain Education --      Exclude from Growth Chart --     Most recent vital signs: Vitals:   11/27/22 1827  BP: 129/87  Pulse: 85  Resp: 18  Temp: 98.4 F (36.9 C)  SpO2: 100%     General: Alert and in no acute distress. Eyes:  PERRL. EOMI. Head: No acute traumatic findings ENT:      Nose: No congestion/rhinnorhea.      Mouth/Throat: Mucous membranes are moist. Neck: No stridor. No cervical spine tenderness to palpation. Cardiovascular:  Good peripheral perfusion Respiratory: Normal respiratory effort without tachypnea or retractions. Lungs CTAB. Good air entry to the bases with no decreased or absent breath  sounds. Gastrointestinal: Bowel sounds 4 quadrants. Soft and nontender to palpation. No guarding or rigidity. No palpable masses. No distention. No CVA tenderness. Musculoskeletal: Full range of motion to all extremities.  Neurologic:  No gross focal neurologic deficits are appreciated.  Skin:   No rash noted Other:   ED Results / Procedures / Treatments   Labs (all labs ordered are listed, but only abnormal results are displayed) Labs Reviewed  URINALYSIS, ROUTINE W REFLEX MICROSCOPIC - Abnormal; Notable for the following components:      Result Value   Color, Urine YELLOW (*)    APPearance HAZY (*)    All other components within normal limits  CBC  HCG, QUANTITATIVE, PREGNANCY  BASIC METABOLIC PANEL  POC URINE PREG, ED        RADIOLOGY  I personally viewed and evaluated these images as part of my medical decision making, as well as reviewing the written report by the radiologist.  ED Provider Interpretation: Pelvic ultrasound unremarkable.   PROCEDURES:  Critical Care performed: No  Procedures   MEDICATIONS ORDERED IN ED: Medications - No data to display   IMPRESSION / MDM / ASSESSMENT  AND PLAN / ED COURSE  I reviewed the triage vital signs and the nursing notes.                              Assessment and plan: Vaginal bleeding Concern for possible pregnancy. 27 year old female presents to the emergency department with vaginal bleeding and concern for possible pregnancy.  Vital signs are reassuring at triage.  On exam, patient was alert and nontoxic-appearing.  CBC and BMP reassuring.  Beta-hCG 1.  Urinalysis shows no signs of UTI.  Pelvic ultrasound unremarkable.  Results of negative pregnancy status were communicated to patient and I recommended gynecology follow-up.     FINAL CLINICAL IMPRESSION(S) / ED DIAGNOSES   Final diagnoses:  Vaginal bleeding     Rx / DC Orders   ED Discharge Orders     None        Note:  This document was  prepared using Dragon voice recognition software and may include unintentional dictation errors.   Pia Mau Corcovado, PA-C 11/27/22 2027    Corena Herter, MD 11/28/22 1610
